# Patient Record
Sex: Female | Born: 1990 | Race: Asian | Hispanic: No | Marital: Married | Smoking: Never smoker
Health system: Southern US, Community
[De-identification: ages and names within clinical notes are randomized; demographics above are authoritative.]

## PROBLEM LIST (undated history)

## (undated) ENCOUNTER — Inpatient Hospital Stay (HOSPITAL_COMMUNITY): Payer: Self-pay

## (undated) DIAGNOSIS — E038 Other specified hypothyroidism: Secondary | ICD-10-CM

## (undated) DIAGNOSIS — D509 Iron deficiency anemia, unspecified: Secondary | ICD-10-CM

## (undated) DIAGNOSIS — Z8679 Personal history of other diseases of the circulatory system: Secondary | ICD-10-CM

## (undated) DIAGNOSIS — Z8742 Personal history of other diseases of the female genital tract: Secondary | ICD-10-CM

## (undated) DIAGNOSIS — Z973 Presence of spectacles and contact lenses: Secondary | ICD-10-CM

## (undated) DIAGNOSIS — L0501 Pilonidal cyst with abscess: Secondary | ICD-10-CM

## (undated) DIAGNOSIS — Z87898 Personal history of other specified conditions: Secondary | ICD-10-CM

## (undated) DIAGNOSIS — Z8639 Personal history of other endocrine, nutritional and metabolic disease: Secondary | ICD-10-CM

---

## 2010-02-24 HISTORY — PX: SHOULDER SURGERY: SHX246

## 2011-12-24 ENCOUNTER — Encounter (HOSPITAL_COMMUNITY): Payer: Self-pay | Admitting: *Deleted

## 2011-12-24 ENCOUNTER — Emergency Department (INDEPENDENT_AMBULATORY_CARE_PROVIDER_SITE_OTHER): Payer: Managed Care, Other (non HMO)

## 2011-12-24 ENCOUNTER — Emergency Department (HOSPITAL_COMMUNITY)
Admission: EM | Admit: 2011-12-24 | Discharge: 2011-12-24 | Disposition: A | Discharge status: Home / Self Care | Payer: Managed Care, Other (non HMO) | Source: Home / Self Care | Attending: Family Medicine | Admitting: Family Medicine

## 2011-12-24 DIAGNOSIS — S63602A Unspecified sprain of left thumb, initial encounter: Secondary | ICD-10-CM

## 2011-12-24 DIAGNOSIS — S6390XA Sprain of unspecified part of unspecified wrist and hand, initial encounter: Secondary | ICD-10-CM

## 2011-12-24 MED ORDER — IBUPROFEN 600 MG PO TABS: 600.0000 mg | ORAL_TABLET | Freq: Three times a day (TID) | ORAL | Status: DC

## 2011-12-24 MED ORDER — TRAMADOL HCL 50 MG PO TABS: 50.0000 mg | ORAL_TABLET | Freq: Four times a day (QID) | ORAL | Status: DC | PRN

## 2011-12-24 NOTE — ED Notes (Signed)
Pt reports  While  Doing  An  Exercise   This  Am   She  Injured  Her  l  Thumb  She  Reports  It  Bent  Back  When    She  Felled  On it     She  Has  Some  Pain and  Slight  Swelling     noted

## 2011-12-24 NOTE — ED Notes (Signed)
Small  l  Thumb  Spica  Applied

## 2011-12-25 NOTE — ED Provider Notes (Signed)
History     CSN: [id number]  Arrival date & time 12/24/11  0865   First MD Initiated Contact with Patient 12/24/11 [phone]      Chief Complaint  Patient presents with  . Hand Injury    (Consider location/radiation/quality/duration/timing/severity/associated sxs/prior treatment) HPI Comments: 21 year old female otherwise previously healthy. Here complaining of left thumb pain. Patient works as a Systems analyst. She describes as she lost her balance while doing exercise this morning and hyper extended her left thumb backwards and also fell on it. If that have been couple of hours ago. She has noted some swelling associated with her pain. Denies dizziness or syncope associated with her fall. Denies injuring her head or any other body areas.   Past Medical History  Diagnosis Date  . Low BP     Past Surgical History  Procedure Date  . Shoulder surgery     No family history on file.  History  Substance Use Topics  . Smoking status: Never Smoker   . Smokeless tobacco: Not on file  . Alcohol Use: No    OB History    Grav Para Term Preterm Abortions TAB SAB Ect Mult Living                  Review of Systems  HENT:       Denies head injury  Musculoskeletal:       As per history of present illness  Neurological: Negative for dizziness, seizures, weakness, numbness and headaches.  All other systems reviewed and are negative.    Allergies  Review of patient's allergies indicates not on file.  Home Medications   Current Outpatient Rx  Name Route Sig Dispense Refill  . IBUPROFEN 600 MG PO TABS Oral Take 1 tablet (600 mg total) by mouth 3 (three) times daily. 21 tablet 0  . TRAMADOL HCL 50 MG PO TABS Oral Take 1 tablet (50 mg total) by mouth every 6 (six) hours as needed for pain. 15 tablet 0    BP 124/77  Pulse 69  Temp 98.2 F (36.8 C) (Oral)  Resp 16  SpO2 100%  LMP 12/14/2011  Physical Exam  Nursing note and vitals reviewed. Constitutional: She appears  well-developed and well-nourished. No distress.  HENT:  Head: Normocephalic and atraumatic.  Eyes: EOM are normal.  Cardiovascular: Normal heart sounds.   Pulmonary/Chest: Breath sounds normal.  Musculoskeletal:       Left hand: Tenderness with palpation and mild swelling without bruising noted at the base of the left thumb more on the palmar side. Patient able to abduct and adduct her left thumb. Normal thumb opposition. Patient able to flex and extend first MP joint as well as DIP joint. There is intact 2 point discrimination sensation over the dorsal and palmar left thumb.    ED Course  Procedures (including critical care time)  Labs Reviewed - No data to display Dg Finger Thumb Left  12/24/2011  *RADIOLOGY REPORT*  Clinical Data: Hand injury  LEFT THUMB 2+V  Comparison: None.  Findings: Three views of the left thumb submitted.  No acute fracture or subluxation.  No radiopaque foreign body.  IMPRESSION: No acute fracture or subluxation.   Original Report Authenticated By: Natasha Mead, M.D.      1. Left thumb sprain       MDM  No bone injuries on x-rays. Impress sprain injury as sore. Range of motion of flexor and extensor tendons on exam. Placed on a thumb spica. Supportive care and  rehabilitation exercises discussed with patient and provided in writing. Asked to followup with a hand specialist if persistent symptoms after 1 week or followup earlier if worsening symptoms despite following treatment.         Sharin Grave, MD 12/26/11 [phone]

## 2012-11-23 ENCOUNTER — Ambulatory Visit: Payer: BC Managed Care – PPO

## 2012-11-23 NOTE — Progress Notes (Unsigned)
  Subjective:    Patient ID: Laura Baxter, female    DOB: 1990/04/19, 22 y.o.   MRN: [id number]  HPI    Review of Systems     Objective:   Physical Exam        Assessment & Plan:

## 2013-03-13 ENCOUNTER — Emergency Department (HOSPITAL_COMMUNITY)
Admission: EM | Admit: 2013-03-13 | Discharge: 2013-03-14 | Disposition: A | Discharge status: Home / Self Care | Payer: BC Managed Care – PPO | Attending: Emergency Medicine | Admitting: Emergency Medicine

## 2013-03-13 ENCOUNTER — Encounter (HOSPITAL_COMMUNITY): Payer: Self-pay | Admitting: Emergency Medicine

## 2013-03-13 ENCOUNTER — Emergency Department (HOSPITAL_COMMUNITY): Payer: BC Managed Care – PPO

## 2013-03-13 DIAGNOSIS — Z3202 Encounter for pregnancy test, result negative: Secondary | ICD-10-CM | POA: Insufficient documentation

## 2013-03-13 DIAGNOSIS — R0602 Shortness of breath: Secondary | ICD-10-CM | POA: Insufficient documentation

## 2013-03-13 DIAGNOSIS — R079 Chest pain, unspecified: Secondary | ICD-10-CM

## 2013-03-13 DIAGNOSIS — M79609 Pain in unspecified limb: Secondary | ICD-10-CM | POA: Insufficient documentation

## 2013-03-13 DIAGNOSIS — R0789 Other chest pain: Secondary | ICD-10-CM | POA: Insufficient documentation

## 2013-03-13 DIAGNOSIS — R209 Unspecified disturbances of skin sensation: Secondary | ICD-10-CM | POA: Insufficient documentation

## 2013-03-13 DIAGNOSIS — R002 Palpitations: Secondary | ICD-10-CM | POA: Insufficient documentation

## 2013-03-13 LAB — CBC
HCT: 39.2 % (ref 36.0–46.0)
Hemoglobin: 13.4 g/dL (ref 12.0–15.0)
MCH: 30.3 pg (ref 26.0–34.0)
MCHC: 34.2 g/dL (ref 30.0–36.0)
MCV: 88.7 fL (ref 78.0–100.0)
Platelets: 258 10*3/uL (ref 150–400)
RBC: 4.42 MIL/uL (ref 3.87–5.11)
RDW: 12.3 % (ref 11.5–15.5)
WBC: 7.2 10*3/uL (ref 4.0–10.5)

## 2013-03-13 LAB — POCT I-STAT TROPONIN I: Troponin i, poc: 0 ng/mL (ref 0.00–0.08)

## 2013-03-13 LAB — BASIC METABOLIC PANEL
BUN: 18 mg/dL (ref 6–23)
CO2: 27 mEq/L (ref 19–32)
Calcium: 9.5 mg/dL (ref 8.4–10.5)
Chloride: 101 mEq/L (ref 96–112)
Creatinine, Ser: 0.74 mg/dL (ref 0.50–1.10)
GFR calc Af Amer: >90 mL/min (ref >90)
GFR calc non Af Amer: >90 mL/min (ref >90)
Glucose, Bld: 94 mg/dL (ref 70–99)
Potassium: 4 mEq/L (ref 3.7–5.3)
Sodium: 140 mEq/L (ref 137–147)

## 2013-03-13 LAB — D-DIMER, QUANTITATIVE: D-Dimer, Quant: <0.27 ug/mL-FEU (ref 0.00–0.48)

## 2013-03-13 LAB — POCT PREGNANCY, URINE: Preg Test, Ur: NEGATIVE

## 2013-03-13 MED ORDER — ONDANSETRON HCL 4 MG/2ML IJ SOLN: 4.0000 mg | Freq: Once | INTRAMUSCULAR | Status: DC

## 2013-03-13 MED ORDER — SODIUM CHLORIDE 0.9 % IV BOLUS (SEPSIS)
1000.0000 mL | Freq: Once | INTRAVENOUS | Status: AC
  Administered 2013-03-13: 1000 mL via INTRAVENOUS

## 2013-03-13 MED ORDER — ASPIRIN 81 MG PO CHEW
324.0000 mg | CHEWABLE_TABLET | Freq: Once | ORAL | Status: AC
  Administered 2013-03-13: 324 mg via ORAL
  Filled 2013-03-13: qty 4

## 2013-03-13 NOTE — ED Provider Notes (Signed)
CSN: [id number]     Arrival date & time 03/13/13  1921 History   First MD Initiated Contact with Patient 03/13/13 2015     Chief Complaint  Patient presents with  . Chest Pain  . Shortness of Breath   (Consider location/radiation/quality/duration/timing/severity/associated sxs/prior Treatment) HPI Comments: 23 year old female presents with intermittent chest pain over the past 2 weeks. She states the pain comes intermittently and there is no obvious eliciting factors. The pain is mostly a tightness in her left upper chest and left lateral chest. There's been no rash. She symptoms he is short of breath with this. It seems always occur when she wakes up but is not waking her up out of sleep. It lasts between minutes to up to an hour. It comes and goes a couple times a day but today it happened more frequently and seemed more intense so she sought evaluation in the ER. She sometimes notices her heart rate is up with his happening and sometimes has palpitations. Occasionally she notices a pain shooting down her arm but not every time. She's also had intermittent left leg tingling and numbness. She's had some left leg pain to the lateral aspect of her knee. No swelling. She was recently on a trip where she drove to [place] and went on a cruise for 5 days a couple weeks ago. The pain is currently mild. 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 When she has had these symptoms she will occasionally check her heart rate and it was in the 110s last time she checked.   Past Medical History  Diagnosis Date  . Low BP   . Allergy    Past Surgical History  Procedure Laterality Date  . Shoulder surgery     Family History  Problem Relation Age of Onset  . Depression Mother   . Anemia Mother   . Cancer Mother   . Diabetes Father   . Heart attack Father   . Breast cancer Paternal Grandmother   . Leukemia Paternal Grandmother   . Leukemia Paternal Grandfather     History  Substance Use Topics  . Smoking status: Never Smoker   . Smokeless tobacco: Not on file  . Alcohol Use: No   OB History   Grav Para Term Preterm Abortions TAB SAB Ect Mult Living                 Review of Systems  Constitutional: Negative for fever and chills.  HENT: Negative for congestion.   Respiratory: Positive for chest tightness and shortness of breath. Negative for cough.   Cardiovascular: Positive for chest pain and palpitations. Negative for leg swelling.  Gastrointestinal: Negative for nausea, vomiting and abdominal pain.  Musculoskeletal: Negative for back pain.  Neurological: Positive for numbness. Negative for weakness and headaches.  All other systems reviewed and are negative.    Allergies  Shellfish allergy  Home Medications  No current outpatient prescriptions on file. BP 124/72  Pulse 70  Temp(Src) 98.2 F (36.8 C) (Oral)  Resp 20  Wt 137 lb (62.143 kg)  SpO2 100%  LMP 03/07/2013 Physical Exam  Nursing note and vitals reviewed. Constitutional: She is oriented to person, place, and time. She appears well-developed and well-nourished.  HENT:  Head: Normocephalic and atraumatic.  Right Ear: External ear normal.  Left Ear: External ear normal.  Nose: Nose normal.  Eyes: Right eye exhibits no discharge. Left eye exhibits no discharge.  Cardiovascular: Normal  rate, regular rhythm and normal heart sounds.   Pulmonary/Chest: Effort normal and breath sounds normal. She exhibits no tenderness.  Abdominal: Soft. She exhibits no distension. There is no tenderness.  Musculoskeletal:       Left upper leg: She exhibits tenderness.       Legs: Neurological: She is alert and oriented to person, place, and time.  Skin: Skin is warm and dry.    ED Course  Procedures (including critical care time) Labs Review Labs Reviewed  CBC  BASIC METABOLIC PANEL  D-DIMER, QUANTITATIVE  POCT PREGNANCY, URINE  POCT I-STAT TROPONIN I   Imaging Review Dg  Chest 2 View  03/13/2013   CLINICAL DATA:  Chest pain for 2 weeks.  EXAM: CHEST  2 VIEW  COMPARISON:  None.  FINDINGS: The lungs are well-aerated and clear. There is no evidence of focal opacification, pleural effusion or pneumothorax.  The heart is normal in size; the mediastinal contour is within normal limits. No acute osseous abnormalities are seen.  IMPRESSION: No acute cardiopulmonary process seen.   Electronically Signed   By: Roanna RaiderJeffery  Chang M.D.   On: 03/13/2013 21:19    EKG Interpretation    Date/Time:  Sunday March 13 2013 19:46:41 EST Ventricular Rate:  67 PR Interval:  124 QRS Duration: 70 QT Interval:  416 QTC Calculation: 439 R Axis:   80 Text Interpretation:  Sinus rhythm Benign early repolarization No old tracing to compare Confirmed by Jabre Heo  MD, Janessa Mickle ([id number]) on 03/13/2013 8:15:43 PM            MDM   1. Chest pain    Patient with atypical symptoms. Given that she has no risk factors and is low risk by HEART score with benign EKG and neg troponin I feel ACS is very unlikely. Low risk for PE, and with neg ddimer this is also low risk. She has two benign EKGs here, and symptoms improved. Her leg pain appears to be more c/w iliotibial band tendinitis given her location of pain, no swelling and frequent exercise and running. No swelling to suggest DVT, and is low risk with neg ddimer. Could be having pain relating to these palpitations, and because of this I recommended getting a PCP, may need further studies such as thyroid (unable to get results back in ED). No significant caffeine use or drug use per patient. Discussed return precautions, and patient and significant other verbalized understandign.    Audree CamelScott T Lemmie Steinhaus, MD 03/13/13 [phone]

## 2013-03-13 NOTE — ED Notes (Signed)
Patient is alert and oriented x3.  She is complaining of chest pain that started 2 weeks ago.  She states that the pain is in  Her upper left chest and she has had radiating pain with numbness in the left arm.  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  She states that she has noticed a jump in her heart rate greater than 20 bpm. She has had lightheaded, dizziness and nausea with the chest pain that happens with exercise or resting.  Currently she rates her  Pain 4 of 10.

## 2013-03-13 NOTE — Discharge Instructions (Signed)
Chest Pain (Nonspecific) °It is often hard to give a specific diagnosis for the cause of chest pain. There is always a chance that your pain could be related to something serious, such as a heart attack or a blood clot in the lungs. You need to follow up with your caregiver for further evaluation. °CAUSES  °· Heartburn. °· Pneumonia or bronchitis. °· Anxiety or stress. °· Inflammation around your heart (pericarditis) or lung (pleuritis or pleurisy). °· A blood clot in the lung. °· A collapsed lung (pneumothorax). It can develop suddenly on its own (spontaneous pneumothorax) or from injury (trauma) to the chest. °· Shingles infection (herpes zoster virus). °The chest wall is composed of bones, muscles, and cartilage. Any of these can be the source of the pain. °· The bones can be bruised by injury. °· The muscles or cartilage can be strained by coughing or overwork. °· The cartilage can be affected by inflammation and become sore (costochondritis). °DIAGNOSIS  °Lab tests or other studies, such as X-rays, electrocardiography, stress testing, or cardiac imaging, may be needed to find the cause of your pain.  °TREATMENT  °· Treatment depends on what may be causing your chest pain. Treatment may include: °· Acid blockers for heartburn. °· Anti-inflammatory medicine. °· Pain medicine for inflammatory conditions. °· Antibiotics if an infection is present. °· You may be advised to change lifestyle habits. This includes stopping smoking and avoiding alcohol, caffeine, and chocolate. °· You may be advised to keep your head raised (elevated) when sleeping. This reduces the chance of acid going backward from your stomach into your esophagus. °· Most of the time, nonspecific chest pain will improve within 2 to 3 days with rest and mild pain medicine. °HOME CARE INSTRUCTIONS  °· If antibiotics were prescribed, take your antibiotics as directed. Finish them even if you start to feel better. °· For the next few days, avoid physical  activities that bring on chest pain. Continue physical activities as directed. °· Do not smoke. °· Avoid drinking alcohol. °· Only take over-the-counter or prescription medicine for pain, discomfort, or fever as directed by your caregiver. °· Follow your caregiver's suggestions for further testing if your chest pain does not go away. °· Keep any follow-up appointments you made. If you do not go to an appointment, you could develop lasting (chronic) problems with pain. If there is any problem keeping an appointment, you must call to reschedule. °SEEK MEDICAL CARE IF:  °· You think you are having problems from the medicine you are taking. Read your medicine instructions carefully. °· Your chest pain does not go away, even after treatment. °· You develop a rash with blisters on your chest. °SEEK IMMEDIATE MEDICAL CARE IF:  °· You have increased chest pain or pain that spreads to your arm, neck, jaw, back, or abdomen. °· You develop shortness of breath, an increasing cough, or you are coughing up blood. °· You have severe back or abdominal pain, feel nauseous, or vomit. °· You develop severe weakness, fainting, or chills. °· You have a fever. °THIS IS AN EMERGENCY. Do not wait to see if the pain will go away. Get medical help at once. Call your local emergency services (911 in [place] Do not drive yourself to the hospital. °MAKE SURE YOU:  °· Understand these instructions. °· Will watch your condition. °· Will get help right away if you are not doing well or get worse. °Document Released: 11/20/2004 Document Revised: 05/05/2011 Document Reviewed: 09/16/2007 °ExitCare® Patient Information ©2014 [employer],   LLC. ° ° ° °Emergency Department Resource Guide °1) Find a Doctor and Pay Out of Pocket °Although you won't have to find out who is covered by your insurance plan, it is a good idea to ask around and get recommendations. You will then need to call the office and see if the doctor you have chosen will accept you as a new  patient and what types of options they offer for patients who are self-pay. Some doctors offer discounts or will set up payment plans for their patients who do not have insurance, but you will need to ask so you aren't surprised when you get to your appointment. ° °2) Contact Your Local Health Department °Not all health departments have doctors that can see patients for sick visits, but many do, so it is worth a call to see if yours does. If you don't know where your local health department is, you can check in your phone book. The CDC also has a tool to help you locate your state's health department, and many state websites also have listings of all of their local health departments. ° °3) Find a Walk-in Clinic °If your illness is not likely to be very severe or complicated, you may want to try a walk in clinic. These are popping up all over the country in pharmacies, drugstores, and shopping centers. They're usually staffed by nurse practitioners or physician assistants that have been trained to treat common illnesses and complaints. They're usually fairly quick and inexpensive. However, if you have serious medical issues or chronic medical problems, these are probably not your best option. ° °No Primary Care Doctor: °- Call [facility] at  832-8000 - they can help you locate a primary care doctor that  accepts your insurance, provides certain services, etc. °- Physician Referral Service- 1-800-533-3463 ° °Chronic Pain Problems: °Organization         Address  Phone   Notes  °[facility]  (336) 297-2271 Patients need to be referred by their primary care doctor.  ° °Medication Assistance: °Organization         Address  Phone   Notes  °[place] [facility] [address] °[place], [place] [address] (336) 641-8030 --Must be a resident of [place] °-- Must have NO insurance coverage whatsoever (no Medicaid/ Medicare, etc.) °-- The pt. MUST have a primary  care doctor that directs their care regularly and follows them in the community °  °[facility]  (866) 331-1348   °[employer]  (888) 892-1162   ° °Agencies that provide inexpensive medical care: °Organization         Address  Phone   Notes  °[facility]  (336) 832-8035   °[facility]    (336) 832-7272   °[facility] [address] °[place], [place] [address] (336) 832-4777   °[facility] [address], °[place] (336) 271-4999   °[facility]    (336) 373-0678   °[facility]    (336) 272-1050   °[facility] ° [address], [place] Phone:  (336) 832-4444, Fax:  (336) 832-4440 Hours of Operation:  9 am - 6 pm, M-F.  Also accepts Medicaid/Medicare and self-pay.  °[facility] ° [address], [place] Phone: (336) 832-3150, Fax: (336) [phone]. Hours of Operation:  8:30 am - 5:30 pm, M-F.  Also accepts Medicaid and self-pay.  °  [facility] [place] [address], [place] Phone: (336) 878-6027   °[facility] [address], [place], [place] (336)723-1848, Ext. 123 Mondays & Thursdays: 7-9 AM.  First 15 patients are seen on a first come, first serve basis. °  ° °Medicaid-accepting [place] Providers: ° °Organization         Address  Phone   Notes  °[facility] [address], [place] (336) 641-2100 Also accepts self-pay patients.  °[facility] [address], [place] ° (336) 856-9996   °[facility] [address], [place] (336) 288-8857   °[facility] [address] [place] [address], [place] (336) 299-7000   °Veita Bland [address], [place]  ° (336) 373-1557 Only accepts [place] Access Medicaid patients after they have their name applied to their card.  ° °Self-Pay (no insurance) in [place]: ° °Organization         Address  Phone   Notes  °Sickle Cell Patients, [facility] [address], [place] (336) 832-1970   °[facility] [address], [place] (336) 832-4400   °[facility] ° [address], [place] (336) 992-4800   °[facility]/Dr. Osei-Bonsu ° [address] [place] [address], [place] or [address], [place] (336) 841-8500 Phone number for both [place] and [place] locations is the same.  °[facility] [address], [place] (336) 299-0000   °[facility] [address] [place] [address], [place] or [address] (336) 852-7530 °(336) 878-2260   °[facility] [address], [place] (336) 350-1642, phone; (336) 294-5005, fax Sees patients 1st and 3rd Saturday of every month.  Must not qualify for public or private insurance (i.e. Medicaid, Medicare, [place] Health Choice, Veterans' Benefits) • Household income should be no more than 200% of the poverty level •The clinic cannot treat you if you are pregnant or think you are pregnant • Sexually transmitted diseases are not treated at the clinic.  ° ° °Dental Care: °Organization         Address  Phone  Notes  °[facility] [facility] [address], [place] (336) 641-6152 Accepts children up to age 21 who are enrolled in Medicaid or [place] Health Choice; pregnant women with a Medicaid card; and children who have applied for Medicaid or [place] Health Choice, but were declined, whose parents can pay a reduced fee at time of service.  °[facility] [place]  [address], [place] (336) 641-7733 Accepts children up to age 21 who are enrolled in Medicaid or [place] Health Choice; pregnant women with a Medicaid card; and children who have applied for Medicaid or [place] Health Choice, but were declined, whose parents can  pay a reduced fee at time of service.  °[facility] ° [address], [place] (336) 641-4533 Patients are seen by appointment only. Walk-ins are not accepted. [facility] will see patients 18 years of age and older. °Monday - Tuesday (8am-5pm) °Most Wednesdays (8:30-5pm) °$30 per visit, cash only  °[facility] ° [address], [place] (336) 641-4533 Patients are seen by appointment only. Walk-ins are not accepted. [facility] will see patients 18 years of   age and older. °One Wednesday Evening (Monthly: Volunteer Based).  $30 per visit, cash only  °[facility]  (919) 537-3737 for adults; Children under age 4, call Graduate Pediatric Dentistry at (919) [phone]. Children aged 4-14, please call (919) [phone] to request a pediatric application. ° Dental services are provided in all areas of dental care including fillings, crowns and bridges, complete and partial dentures, implants, gum treatment, root canals, and extractions. Preventive care is also provided. Treatment is provided to both adults and children. °Patients are selected via a lottery and there is often a waiting list. °  °[facility] [address], °[place] ° (336) 763-8833 [email or web address] °  °[facility] [address], [place], [place] (336)723-1848, Ext. 123 Second and Fourth Thursday of each month, opens at 6:30 AM; Clinic ends at 9 AM.  Patients are seen on a first-come first-served basis, and a limited number are seen during each clinic.  ° °[facility] ° [address], [place], [place] (336) 723-7904   Eligibility Requirements °You must have lived in [place], [place], or [place] counties for at least the last three months. °  You cannot be eligible for state or federal sponsored healthcare insurance, including Veterans Administration, Medicaid, or Medicare. °  You generally cannot be eligible for healthcare  insurance through your employer.  °  How to apply: °Eligibility screenings are held every Tuesday and Wednesday afternoon from 1:00 pm until 4:00 pm. You do not need an appointment for the interview!  °[facility] [address], [place], [place] 336-631-2330   °[facility]  336-342-8273   °[facility]  336-703-3100   °[facility]  336-570-6415   ° °Behavioral Health Resources in the Community: °Intensive Outpatient Programs °Organization         Address  Phone  Notes  °[facility] [address], [place], [place] 336-878-6098   °[facility] [address], [place], [place] 336-832-9800   °[facility] [address], [place], [place] ° 336-882-2125   °[facility] [address],  °[place], [place] 1-800-853-5163 or 336-641-4981   °Substance Abuse Resources °Organization         Address  Phone  Notes  °[facility]  336-882-2125   °[facility]  336-784-9470   °[facility]  336-285-9073   °[facility]  336-845-3988   °Residential & Outpatient Substance Abuse Program  1-800-659-3381   °Psychological Services °Organization         Address  Phone  Notes  °[facility]  336- 832-9600   °[facility]  336- 378-7881   °[facility] [address], [place] 1-800-853-5163 or 336-641-4981   ° °Mobile Crisis Teams °Organization         Address  Phone  Notes  °[facility], Mobile Crisis Care Unit  1-877-626-1772   °Assertive °[facility] ° [address] [place], [place] 336-834-9664   °Sharon DeEsch [address] °[place] 336-554-5454   ° °Self-Help/Support Groups °Organization         Address  Phone             Notes  °[facility] - variety of support groups  336- 373-1402 Call for more information  °Narcotics Anonymous (NA),  [facility] [address], °[place]   2   meetings at this location  ° °Residential Treatment Programs °Organization         Address  Phone  Notes  °[facility] [address],    °[place]  1-866-801-8205   °[facility] ° [address], [place], [place] 704-293-8524   °[facility] [address], [place] 336-845-3988 Admissions: 8am-3pm M-F  °[facility] [address]    °[place], [place] 336-841-1104   °[facility] [address], [place], [place] 336-379-7146   °[facility] [address]  °[place], [place] 336-285-9073   °[facility] - Intensive Outpatient [address], [place], [place] 336-852-3033   °[facility] [address]  °[place], [place] 1-877-615-2722 or 336-784-9470   °[facility] [address] [place], [place] 336-227-7417 Accepts Medicaid  °[facility] [address]  °[place] 1-800-659-3381 Substance Abuse/Addiction Treatment  ° °[place] Behavioral Health Resources °Organization         Address  Phone  Notes  °[facility]  (888) 581-9988   °Julie Brannon, PhD [address] [place], [place]   (336) 349-5553 or (336) 951-0000   °[facility]   [address] °[place], [place] (336) 349-4454   °[facility] [address], [place], [place] (336) 342-8316 Insurance/Medicaid/sponsorship through [facility]  °[facility] [address]                                    [place], [place] (336) 342-8316 Therapy/tele-psych/case  °[facility] [address]  ° [place], [place] (336) 349-2233    °Dr. Arfeen  (336) 349-4544   °[facility] [place]  [employer] [place] [facility] 1) [address],  °2) [address], [place] °3)  [address], [place] (336) 349-3220 °(336) 342-7768 ° °(336) 342-8140    °[place] [facility] (336) 342-1394 or (336) 342-3537 (After Hours)    ° ° ° °

## 2013-03-14 NOTE — ED Notes (Signed)
Pt ambulatory with family to check out window with family. NAD.

## 2016-02-25 NOTE — L&D Delivery Note (Signed)
Operative Delivery Note At 2:16 AM, on August 22, 2016, a viable female "Laura Baxter" was delivered via Vaginal, Spontaneous Delivery.  Presentation: vertex; Position: Left,, Occiput,, Anterior with manual restitution to LOT. After delivery of head, shoulders dystocia noted and maneuvers implemented as below resulting in release of anterior shoulder and delivery of infant.  Time from delivery of head to delivery of body less than 40 seconds.  Infant with good tone and tactile stimulation and bulb suction given by provider to stimulate grimace. Infant placed on mother's abdomen where nurse's continued tactile stimulation.  Infant APGAR: 8, 9.  Cord clamped, cut, and blood collected. Placenta delivered spontaneously and noted to be intact with 3VC upon inspection.  Vaginal inspection revealed a 1st degree vaginal laceration and bilateral labial lacerations that was repaired with 3-0 vicryl on CT-1 and SH, respectively.  Patient also with hemostatic periurethral laceration that was not repaired.  Anesthetic necessary and patient given ~1515mL of lidocaine.  Patient tolerated the procedure well. Fundus firm, at the umbilicus, and bleeding small.  Mother hemodynamically stable and infant skin to skin, with father, prior to provider exit.  Mother desires IUD for birth control and opts to breast and bottle feed.  Infant weight at one hour of life: 7lbs 8oz, 19.75 in  Delivery of the head: 08/22/2016  2:16 AM First maneuver: 08/22/2016  2:16 AM, [place] Second maneuver: 08/22/2016  2:16 AM, Suprapubic Pressure Third maneuver: ,   Fourth maneuver: ,   Fifth maneuver: ,   Sixth maneuver: ,    Anesthesia:  Local for Repair Episiotomy: None Lacerations: Bilateral Labial, 1st Degree Vaginal, and Hemostatic Periuretheral Suture Repair: 3.0 vicryl Est. Blood Loss (mL):  300  Mom to postpartum.  Baby to Couplet care / Skin to Skin.  Cherre RobinsJessica L Deeric Cruise MSN, CNM 08/22/2016, 3:09 AM

## 2016-07-10 ENCOUNTER — Encounter: Payer: Managed Care, Other (non HMO) | Admitting: Obstetrics and Gynecology

## 2016-07-17 LAB — OB RESULTS CONSOLE RUBELLA ANTIBODY, IGM: Rubella: IMMUNE

## 2016-07-17 LAB — OB RESULTS CONSOLE RPR: RPR: NONREACTIVE

## 2016-07-17 LAB — OB RESULTS CONSOLE GBS: GBS: NEGATIVE

## 2016-07-17 LAB — OB RESULTS CONSOLE HEPATITIS B SURFACE ANTIGEN: Hepatitis B Surface Ag: NEGATIVE

## 2016-07-17 LAB — OB RESULTS CONSOLE HIV ANTIBODY (ROUTINE TESTING): HIV: NONREACTIVE

## 2016-07-30 ENCOUNTER — Inpatient Hospital Stay (HOSPITAL_COMMUNITY)
Admission: AD | Admit: 2016-07-30 | Discharge: 2016-07-30 | Disposition: A | Discharge status: Home / Self Care | Payer: No Typology Code available for payment source | Source: Ambulatory Visit | Attending: Obstetrics & Gynecology | Admitting: Obstetrics & Gynecology

## 2016-07-30 ENCOUNTER — Encounter (HOSPITAL_COMMUNITY): Payer: Self-pay | Admitting: *Deleted

## 2016-07-30 DIAGNOSIS — O9989 Other specified diseases and conditions complicating pregnancy, childbirth and the puerperium: Secondary | ICD-10-CM

## 2016-07-30 DIAGNOSIS — Z3A38 38 weeks gestation of pregnancy: Secondary | ICD-10-CM | POA: Diagnosis not present

## 2016-07-30 DIAGNOSIS — O99891 Other specified diseases and conditions complicating pregnancy: Secondary | ICD-10-CM

## 2016-07-30 DIAGNOSIS — M549 Dorsalgia, unspecified: Secondary | ICD-10-CM

## 2016-07-30 DIAGNOSIS — Z91013 Allergy to seafood: Secondary | ICD-10-CM | POA: Diagnosis not present

## 2016-07-30 DIAGNOSIS — Z79899 Other long term (current) drug therapy: Secondary | ICD-10-CM | POA: Diagnosis not present

## 2016-07-30 DIAGNOSIS — O471 False labor at or after 37 completed weeks of gestation: Secondary | ICD-10-CM | POA: Diagnosis not present

## 2016-07-30 DIAGNOSIS — Z3689 Encounter for other specified antenatal screening: Secondary | ICD-10-CM

## 2016-07-30 DIAGNOSIS — O479 False labor, unspecified: Secondary | ICD-10-CM

## 2016-07-30 NOTE — MAU Provider Note (Signed)
History     CSN: [id number]  Arrival date and time: 07/30/16 1513   None     Chief Complaint  Patient presents with  . Labor Eval   HPI   [name] is a 26 y.o. female G1P0 @ [redacted]w[redacted]d here in MAU from the office for an NST. She was seen today in the office and their were ? Variables seen on the monitor. She was sent here by Dr. Sallye Ober. She has had some back pain and occasional contraction pain. No vaginal bleeding or leaking of water.   OB History    Gravida Para Term Preterm AB Living   1             SAB TAB Ectopic Multiple Live Births                  Past Medical History:  Diagnosis Date  . Allergy   . Low BP   . Medical history non-contributory     Past Surgical History:  Procedure Laterality Date  . SHOULDER SURGERY      Family History  Problem Relation Age of Onset  . Depression Mother   . Anemia Mother   . Cancer Mother   . Diabetes Father   . Heart attack Father   . Breast cancer Paternal Grandmother   . Leukemia Paternal Grandmother   . Leukemia Paternal Grandfather     Social History  Substance Use Topics  . Smoking status: Never Smoker  . Smokeless tobacco: Never Used  . Alcohol use No    Allergies:  Allergies  Allergen Reactions  . Iodine Anaphylaxis  . Shellfish Allergy Anaphylaxis    Prescriptions Prior to Admission  Medication Sig Dispense Refill Last Dose  . IRON PO Take 1 tablet by mouth every morning.   07/30/2016 at Unknown time  . Prenatal Vit-Fe Fumarate-FA (PRENATAL MULTIVITAMIN) TABS tablet Take 1 tablet by mouth daily at 12 noon.   07/30/2016 at Unknown time  . promethazine (PHENERGAN) 12.5 MG tablet Take 12.5 mg by mouth every 6 (six) hours as needed for nausea or vomiting.   Past Week at Unknown time   No results found for this or any previous visit (from the past 48 hour(s)).  Review of Systems  Gastrointestinal: Negative for abdominal pain.  Genitourinary: Negative for vaginal bleeding.   Physical Exam   Blood  pressure 116/69, pulse 98, temperature 98.4 F (36.9 C), temperature source Oral, resp. rate 16, height 5\' 4"  (1.626 m), weight 166 lb (75.3 kg), SpO2 98 %.  Physical Exam  Constitutional: She appears well-developed and well-nourished.  HENT:  Head: Normocephalic.  Respiratory: Effort normal.  GI: Soft. She exhibits no distension. There is no tenderness. There is no rebound and no guarding.  Genitourinary:  Genitourinary Comments: Dilation: 1 Effacement (%): Thick Cervical Position: Posterior Station: -2 Presentation: Vertex Exam by:: [place]on,RN  Musculoskeletal: Normal range of motion.  Skin: Skin is warm. She is not diaphoretic.  Psychiatric: Her behavior is normal.   Fetal Tracing: Baseline: 125 bpm Variability: Moderate  Accelerations: 15x15 Decelerations: None Toco: Occasional UI  MAU Course  Procedures  None  MDM  Reactive NST Cervix unchanged from prior exam.  Discussed patient with Dr. Sallye Ober  Assessment and Plan   A:  1. NST (non-stress test) reactive   2. Back pain in pregnancy   3. False labor     P:  Discharge home in stable condition Return to MAU if symptoms worsen Ok to use tylenol as  needed, as directed on the bottle  Follow up with [facility]  Keaundra Stehle I, NP 07/30/2016 5:49 PM

## 2016-07-30 NOTE — MAU Note (Signed)
Pt reports she "had a really tight belly for 24 hours" and went to the doctor's office today and they put her on the monitor and the baby's heartrate was high and she was having contractions.

## 2016-07-30 NOTE — Discharge Instructions (Signed)
Third Trimester of Pregnancy The third trimester is from week 28 through week 40 (months 7 through 9). The third trimester is a time when the unborn baby (fetus) is growing rapidly. At the end of the ninth month, the fetus is about 20 inches in length and weighs 6-10 pounds. Body changes during your third trimester Your body will continue to go through many changes during pregnancy. The changes vary from woman to woman. During the third trimester:  Your weight will continue to increase. You can expect to gain 25-35 pounds (11-16 kg) by the end of the pregnancy.  You may begin to get stretch marks on your hips, abdomen, and breasts.  You may urinate more often because the fetus is moving lower into your pelvis and pressing on your bladder.  You may develop or continue to have heartburn. This is caused by increased hormones that slow down muscles in the digestive tract.  You may develop or continue to have constipation because increased hormones slow digestion and cause the muscles that push waste through your intestines to relax.  You may develop hemorrhoids. These are swollen veins (varicose veins) in the rectum that can itch or be painful.  You may develop swollen, bulging veins (varicose veins) in your legs.  You may have increased body aches in the pelvis, back, or thighs. This is due to weight gain and increased hormones that are relaxing your joints.  You may have changes in your hair. These can include thickening of your hair, rapid growth, and changes in texture. Some women also have hair loss during or after pregnancy, or hair that feels dry or thin. Your hair will most likely return to normal after your baby is born.  Your breasts will continue to grow and they will continue to become tender. A yellow fluid (colostrum) may leak from your breasts. This is the first milk you are producing for your baby.  Your belly button may stick out.  You may notice more swelling in your hands,  face, or ankles.  You may have increased tingling or numbness in your hands, arms, and legs. The skin on your belly may also feel numb.  You may feel short of breath because of your expanding uterus.  You may have more problems sleeping. This can be caused by the size of your belly, increased need to urinate, and an increase in your body's metabolism.  You may notice the fetus "dropping," or moving lower in your abdomen (lightening).  You may have increased vaginal discharge.  You may notice your joints feel loose and you may have pain around your pelvic bone.  What to expect at prenatal visits You will have prenatal exams every 2 weeks until week 36. Then you will have weekly prenatal exams. During a routine prenatal visit:  You will be weighed to make sure you and the baby are growing normally.  Your blood pressure will be taken.  Your abdomen will be measured to track your baby's growth.  The fetal heartbeat will be listened to.  Any test results from the previous visit will be discussed.  You may have a cervical check near your due date to see if your cervix has softened or thinned (effaced).  You will be tested for Group B streptococcus. This happens between 35 and 37 weeks.  Your health care provider may ask you:  What your birth plan is.  How you are feeling.  If you are feeling the baby move.  If you have had   any abnormal symptoms, such as leaking fluid, bleeding, severe headaches, or abdominal cramping.  If you are using any tobacco products, including cigarettes, chewing tobacco, and electronic cigarettes.  If you have any questions.  Other tests or screenings that may be performed during your third trimester include:  Blood tests that check for low iron levels (anemia).  Fetal testing to check the health, activity level, and growth of the fetus. Testing is done if you have certain medical conditions or if there are problems during the  pregnancy.  Nonstress test (NST). This test checks the health of your baby to make sure there are no signs of problems, such as the baby not getting enough oxygen. During this test, a belt is placed around your belly. The baby is made to move, and its heart rate is monitored during movement.  What is false labor? False labor is a condition in which you feel small, irregular tightenings of the muscles in the womb (contractions) that usually go away with rest, changing position, or drinking water. These are called Braxton Hicks contractions. Contractions may last for hours, days, or even weeks before true labor sets in. If contractions come at regular intervals, become more frequent, increase in intensity, or become painful, you should see your health care provider. What are the signs of labor?  Abdominal cramps.  Regular contractions that start at 10 minutes apart and become stronger and more frequent with time.  Contractions that start on the top of the uterus and spread down to the lower abdomen and back.  Increased pelvic pressure and dull back pain.  A watery or bloody mucus discharge that comes from the vagina.  Leaking of amniotic fluid. This is also known as your "water breaking." It could be a slow trickle or a gush. Let your health care provider know if it has a color or strange odor. If you have any of these signs, call your health care provider right away, even if it is before your due date. Follow these instructions at home: Medicines  Follow your health care provider's instructions regarding medicine use. Specific medicines may be either safe or unsafe to take during pregnancy.  Take a prenatal vitamin that contains at least 600 micrograms (mcg) of folic acid.  If you develop constipation, try taking a stool softener if your health care provider approves. Eating and drinking  Eat a balanced diet that includes fresh fruits and vegetables, whole grains, good sources of protein  such as meat, eggs, or tofu, and low-fat dairy. Your health care provider will help you determine the amount of weight gain that is right for you.  Avoid raw meat and uncooked cheese. These carry germs that can cause birth defects in the baby.  If you have low calcium intake from food, talk to your health care provider about whether you should take a daily calcium supplement.  Eat four or five small meals rather than three large meals a day.  Limit foods that are high in fat and processed sugars, such as fried and sweet foods.  To prevent constipation: ? Drink enough fluid to keep your urine clear or pale yellow. ? Eat foods that are high in fiber, such as fresh fruits and vegetables, whole grains, and beans. Activity  Exercise only as directed by your health care provider. Most women can continue their usual exercise routine during pregnancy. Try to exercise for 30 minutes at least 5 days a week. Stop exercising if you experience uterine contractions.  Avoid heavy   lifting.  Do not exercise in extreme heat or humidity, or at high altitudes.  Wear low-heel, comfortable shoes.  Practice good posture.  You may continue to have sex unless your health care provider tells you otherwise. Relieving pain and discomfort  Take frequent breaks and rest with your legs elevated if you have leg cramps or low back pain.  Take warm sitz baths to soothe any pain or discomfort caused by hemorrhoids. Use hemorrhoid cream if your health care provider approves.  Wear a good support bra to prevent discomfort from breast tenderness.  If you develop varicose veins: ? Wear support pantyhose or compression stockings as told by your healthcare provider. ? Elevate your feet for 15 minutes, 3-4 times a day. Prenatal care  Write down your questions. Take them to your prenatal visits.  Keep all your prenatal visits as told by your health care provider. This is important. Safety  Wear your seat belt at  all times when driving.  Make a list of emergency phone numbers, including numbers for family, friends, the hospital, and police and fire departments. General instructions  Avoid cat litter boxes and soil used by cats. These carry germs that can cause birth defects in the baby. If you have a cat, ask someone to clean the litter box for you.  Do not travel far distances unless it is absolutely necessary and only with the approval of your health care provider.  Do not use hot tubs, steam rooms, or saunas.  Do not drink alcohol.  Do not use any products that contain nicotine or tobacco, such as cigarettes and e-cigarettes. If you need help quitting, ask your health care provider.  Do not use any medicinal herbs or unprescribed drugs. These chemicals affect the formation and growth of the baby.  Do not douche or use tampons or scented sanitary pads.  Do not cross your legs for long periods of time.  To prepare for the arrival of your baby: ? Take prenatal classes to understand, practice, and ask questions about labor and delivery. ? Make a trial run to the hospital. ? Visit the hospital and tour the maternity area. ? Arrange for maternity or paternity leave through employers. ? Arrange for family and friends to take care of pets while you are in the hospital. ? Purchase a rear-facing car seat and make sure you know how to install it in your car. ? Pack your hospital bag. ? Prepare the baby's nursery. Make sure to remove all pillows and stuffed animals from the baby's crib to prevent suffocation.  Visit your dentist if you have not gone during your pregnancy. Use a soft toothbrush to brush your teeth and be gentle when you floss. Contact a health care provider if:  You are unsure if you are in labor or if your water has broken.  You become dizzy.  You have mild pelvic cramps, pelvic pressure, or nagging pain in your abdominal area.  You have lower back pain.  You have persistent  nausea, vomiting, or diarrhea.  You have an unusual or bad smelling vaginal discharge.  You have pain when you urinate. Get help right away if:  Your water breaks before 37 weeks.  You have regular contractions less than 5 minutes apart before 37 weeks.  You have a fever.  You are leaking fluid from your vagina.  You have spotting or bleeding from your vagina.  You have severe abdominal pain or cramping.  You have rapid weight loss or weight gain.    You have shortness of breath with chest pain.  You notice sudden or extreme swelling of your face, hands, ankles, feet, or legs.  Your baby makes fewer than 10 movements in 2 hours.  You have severe headaches that do not go away when you take medicine.  You have vision changes. Summary  The third trimester is from week 28 through week 40, months 7 through 9. The third trimester is a time when the unborn baby (fetus) is growing rapidly.  During the third trimester, your discomfort may increase as you and your baby continue to gain weight. You may have abdominal, leg, and back pain, sleeping problems, and an increased need to urinate.  During the third trimester your breasts will keep growing and they will continue to become tender. A yellow fluid (colostrum) may leak from your breasts. This is the first milk you are producing for your baby.  False labor is a condition in which you feel small, irregular tightenings of the muscles in the womb (contractions) that eventually go away. These are called Braxton Hicks contractions. Contractions may last for hours, days, or even weeks before true labor sets in.  Signs of labor can include: abdominal cramps; regular contractions that start at 10 minutes apart and become stronger and more frequent with time; watery or bloody mucus discharge that comes from the vagina; increased pelvic pressure and dull back pain; and leaking of amniotic fluid. This information is not intended to replace advice  given to you by your health care provider. Make sure you discuss any questions you have with your health care provider. Document Released: 02/04/2001 Document Revised: 07/19/2015 Document Reviewed: 04/13/2012 Elsevier Interactive Patient Education  2017 [employer] Inc[employer]  

## 2016-08-15 ENCOUNTER — Inpatient Hospital Stay (HOSPITAL_COMMUNITY)
Admission: AD | Admit: 2016-08-15 | Discharge: 2016-08-16 | Disposition: A | Discharge status: Home / Self Care | Payer: No Typology Code available for payment source | Source: Ambulatory Visit | Attending: Obstetrics and Gynecology | Admitting: Obstetrics and Gynecology

## 2016-08-15 DIAGNOSIS — Z3A40 40 weeks gestation of pregnancy: Secondary | ICD-10-CM | POA: Insufficient documentation

## 2016-08-15 NOTE — MAU Note (Signed)
Ctxs since 1900. Some bloody show. 3cm in office this am

## 2016-08-16 ENCOUNTER — Encounter (HOSPITAL_COMMUNITY): Payer: Self-pay | Admitting: *Deleted

## 2016-08-16 DIAGNOSIS — Z3A40 40 weeks gestation of pregnancy: Secondary | ICD-10-CM | POA: Diagnosis not present

## 2016-08-16 NOTE — MAU Provider Note (Signed)
History    Laura Baxter is a 26y.o. G1P0 at 40.3wks who presents, after phone call, for contractions.  Patient states contractions started at 1900 and became regular around 2130.  Patient denies LOF and VB while endorsing fetal movement.  Patient desires minimal interventions and is open to pain management, but declines at current.   There are no active problems to display for this patient.   Chief Complaint  Patient presents with  . Contractions   HPI  OB History    Gravida Para Term Preterm AB Living   1             SAB TAB Ectopic Multiple Live Births                  Past Medical History:  Diagnosis Date  . Allergy   . Low BP   . Medical history non-contributory     Past Surgical History:  Procedure Laterality Date  . SHOULDER SURGERY      Family History  Problem Relation Age of Onset  . Depression Mother   . Anemia Mother   . Cancer Mother   . Diabetes Father   . Heart attack Father   . Breast cancer Paternal Grandmother   . Leukemia Paternal Grandmother   . Leukemia Paternal Grandfather     Social History  Substance Use Topics  . Smoking status: Never Smoker  . Smokeless tobacco: Never Used  . Alcohol use No    Allergies:  Allergies  Allergen Reactions  . Iodine Anaphylaxis  . Shellfish Allergy Anaphylaxis    Prescriptions Prior to Admission  Medication Sig Dispense Refill Last Dose  . IRON PO Take 1 tablet by mouth every morning.   07/30/2016 at Unknown time  . Prenatal Vit-Fe Fumarate-FA (PRENATAL MULTIVITAMIN) TABS tablet Take 1 tablet by mouth daily at 12 noon.   07/30/2016 at Unknown time  . promethazine (PHENERGAN) 12.5 MG tablet Take 12.5 mg by mouth every 6 (six) hours as needed for nausea or vomiting.   Past Week at Unknown time    ROS  See HPI Above Physical Exam   Blood pressure (!) 106/56, pulse 71, temperature 97.4 F (36.3 C), resp. rate 18, height 5\' 4"  (1.626 m), weight 76.7 kg (169 lb).  No results found for this or any  previous visit (from the past 24 hour(s)).  Physical Exam  Constitutional: She is oriented to person, place, and time. She appears well-developed and well-nourished. No distress.  HENT:  Head: Normocephalic and atraumatic.  Eyes: Conjunctivae are normal.  Neck: Normal range of motion.  Cardiovascular: Normal rate.   Respiratory: Effort normal.  GI: Soft.  Musculoskeletal: Normal range of motion. She exhibits no edema.  Neurological: She is alert and oriented to person, place, and time.  Skin: Skin is warm and dry.   SVE: 4/70/-3  FHR: 125 bpm, Mod Var, -Decels, +Accels UC: Q118min, graphs mild ED Course  Assessment: IUP at 40.3wks Cat I FT Early Labor Desires Minimal Intervention  Plan: -Given Early Labor Options:  A: Home with or w/o therapeutic rest B: Ambulate and reassess C: Pain medication and reassess  -Patient opts for B -Can ambulate after reactive NST -Will reassess in 2 hours  Follow Up (0205) -SVE remains the same -Discharge to home Encouraged to call if any questions or concerns arise prior to next scheduled office visit.    Cherre RobinsJessica L Phelan Schadt CNM, MSN 08/16/2016 12:45 AM

## 2016-08-16 NOTE — Discharge Instructions (Signed)

## 2016-08-17 ENCOUNTER — Inpatient Hospital Stay (HOSPITAL_COMMUNITY)
Admission: AD | Admit: 2016-08-17 | Discharge: 2016-08-17 | Disposition: A | Discharge status: Home / Self Care | Payer: No Typology Code available for payment source | Source: Ambulatory Visit | Attending: Obstetrics & Gynecology | Admitting: Obstetrics & Gynecology

## 2016-08-17 ENCOUNTER — Encounter (HOSPITAL_COMMUNITY): Payer: Self-pay | Admitting: *Deleted

## 2016-08-17 ENCOUNTER — Inpatient Hospital Stay (HOSPITAL_COMMUNITY): Payer: No Typology Code available for payment source

## 2016-08-17 DIAGNOSIS — Q6689 Other  specified congenital deformities of feet: Secondary | ICD-10-CM

## 2016-08-17 DIAGNOSIS — Z8279 Family history of other congenital malformations, deformations and chromosomal abnormalities: Secondary | ICD-10-CM

## 2016-08-17 DIAGNOSIS — Z91013 Allergy to seafood: Secondary | ICD-10-CM

## 2016-08-17 DIAGNOSIS — O480 Post-term pregnancy: Secondary | ICD-10-CM

## 2016-08-17 DIAGNOSIS — Z888 Allergy status to other drugs, medicaments and biological substances status: Secondary | ICD-10-CM

## 2016-08-17 NOTE — Discharge Instructions (Signed)

## 2016-08-17 NOTE — MAU Provider Note (Signed)
History   26 yo G1P0 at 70 4/7 weeks presented after calling with "something hanging out of vagina"--denied leaking/bleeding, reported +FM.  Seen 08/16/16 in MAU by Gerrit Heck, CNM, with UCs--cervix remained unchanged at 4 cm, with UCs q 8 min, mild.  Patient waiting to hear about induction to be scheduled at 41 weeks, agreeable with that plan.  No office visits scheduled this upcoming week.  Patient is transfer from [place] at 36 weeks--GBS negative, [place] for EFW at 36 weeks at 36%ile, AFI 70%ile.  Patient Active Problem List   Diagnosis Date Noted  . 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 Family history of cleft lip and palate 08/17/2016  . Post-dates pregnancy 08/17/2016    Chief Complaint  Patient presents with  . ?something coming out of vagina   HPI:  As above  OB History    Gravida Para Term Preterm AB Living   1             SAB TAB Ectopic Multiple Live Births                  Past Medical History:  Diagnosis Date  . Allergy   . Anemia   . Infection    UTI  . Leg fracture    shin, ? left  . Low BP   . Ovarian cyst   . Seizures (HCC)    febrile as infant  . Wrist fracture    right    Past Surgical History:  Procedure Laterality Date  . SHOULDER SURGERY Left     Family History  Problem Relation Age of Onset  . Depression Mother   . Anemia Mother   . Heart attack Father   . Hypertension Father   . Hyperlipidemia Father   . Heart disease Father   . Depression Father   . Glaucoma Father   . Cancer Maternal Grandmother        pancreatic  . Breast cancer Paternal Grandmother   . Leukemia Paternal Grandmother   . Leukemia Paternal Grandfather     Social History  Substance Use Topics  . Smoking status: Never Smoker  . Smokeless tobacco: Never Used  . Alcohol use No    Allergies:  Allergies  Allergen Reactions  . Iodine Anaphylaxis  . Shellfish Allergy Anaphylaxis    Prescriptions Prior to Admission  Medication Sig Dispense Refill  Last Dose  . IRON PO Take 1 tablet by mouth every morning.   07/30/2016 at Unknown time  . Prenatal Vit-Fe Fumarate-FA (PRENATAL MULTIVITAMIN) TABS tablet Take 1 tablet by mouth daily at 12 noon.   07/30/2016 at Unknown time  . promethazine (PHENERGAN) 12.5 MG tablet Take 12.5 mg by mouth every 6 (six) hours as needed for nausea or vomiting.   Past Week at Unknown time    ROS:  Occasional UCs, +FM. Physical Exam   Blood pressure 112/73, pulse 91, temperature 98.1 F (36.7 C), temperature source Oral, resp. rate 16.    Physical Exam  In NAD Chest clear Heart RRR without murmur Abd gravid, NT Pelvic--slightly engorged vaginal tissue at introitus, no prolapsed cord, cervix  3 cm, 70%, vtx, -1, mucusy d/c noted. Ext WNL  FHR Category 1--baseline 160-170, with accels to 180 with frequent fetal movement, no audible decels. UCs none  ED Course  Assessment: IUP at 40 4/7 weeks No evidence prolapsed cord No evidence labor Post-dates  Plan: Continue to monitor FHR at present. If baseline resolves to clearly WNL, will d/c home.  Nigel BridgemanLATHAM, Thadius Smisek CNM, MSN 08/17/2016 5:40 PM   Addendum: FHR Category 1, with FHR baseline segment 145-155, accels noted, frequent FM, but other segments have baseline 160-170, with accels. Baseline on NST 6/23 was 120-130. No UCs.  Will get BPP and AFI.  If WNL, anticipate d/c patient home. Dr. Sallye OberKulwa updated on patient status.  Nigel BridgemanVicki Loanne Emery, CNM 08/17/16   Addendum: Returned from US--BPP 8/8, fetus very active, AFI WNL. D/C'd home with labor precautions, FKCs. Office will contact her tomorrow to schedule ROB visit and NST for 6/27. Scheduled for induction 08/21/16 at 7am. Call prn with questions or concerns.  Nigel BridgemanVicki Alford Gamero, CNM 08/17/16 7:07p

## 2016-08-18 ENCOUNTER — Telehealth (HOSPITAL_COMMUNITY): Payer: Self-pay | Admitting: *Deleted

## 2016-08-18 ENCOUNTER — Encounter (HOSPITAL_COMMUNITY): Payer: Self-pay | Admitting: *Deleted

## 2016-08-18 NOTE — Telephone Encounter (Signed)
Preadmission screen  

## 2016-08-20 NOTE — H&P (Signed)
Laura Baxter is a 26 y.o. female, G1P0 at 1741 1/7 weeks, presenting for induction due to postdates.    Patient Active Problem List   Diagnosis Date Noted  . 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 Family history of cleft lip and palate 08/17/2016  . Post-dates pregnancy 08/17/2016  . Shellfish allergy 08/17/2016  . 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   Anatomy scan:  Approx 20 weeks, with normal findings and an anterior placenta.   Additional US evaluations:   36 1/7 weeks:  EFW 37%ile, vtx, AFI 18.8 Significant prenatal events:  Transfer in at 36 weeks with prior uncomplicated pregnancy.  Hx of normal Panorama, with female gender, no AFP.  Sent to MAU for evaluation on 07/30/16 due to NST with ? Baseline--reactive NST there, no issues.  Seen in MAU 6/24 for ? Labor, cervix 4, 70%, vtx, -3, irregular contractions, Cat 1 FHR.  Seen 08/17/16 in MAU, cervix 3-4, occasional UCs, FHR baseline at times 160-170 with accels, BPP 8/8, AFI 20, then baseline decreased to 150s with clear reactivity.  D/c'd home, with plan for induction on 6/28 (6/27 is 41 weeks, but patient requested 6/28 due to husband's schedule.   Last evaluation:  08/20/16--cervix 3-4 cm, 70%, vtx, -2, BP 120/70, weight 168, reactive NST.  OB History    Gravida Para Term Preterm AB Living   2       1     SAB TAB Ectopic Multiple Live Births   1            2011--SAB   Past Medical History:  Diagnosis Date  . Allergy   . Anemia   . Infection    UTI  . Leg fracture    shin, ? left  . Low BP   . Ovarian cyst   . Seizures (HCC)    febrile as infant  . Wrist fracture    right   Past Surgical History:  Procedure Laterality Date  . SHOULDER SURGERY Left    Family History: family history includes Anemia in  her mother; Breast cancer in her paternal grandmother; Cancer in her maternal grandmother; Depression in her father and mother; Glaucoma in her father; Luiz BlareGraves' disease in her father; Heart attack in her father; Heart disease in her father; Hyperlipidemia in her father; Hypertension in her father; Leukemia in her paternal grandfather and paternal grandmother.   Social History:  reports that she has never smoked. She has never used smokeless tobacco. She reports that she does not drink alcohol or use drugs. Patient is Caucasian, a Careers information officerlaw student, married to [place] Cherry, who is involved and supportive.   Prenatal Transfer Tool  Maternal Diabetes: No Genetic Screening: Normal Panorama and CF screening Maternal Ultrasounds/Referrals: Normal Fetal Ultrasounds or other Referrals:  None Maternal Substance Abuse:  No Significant Maternal Medications:  None Significant Maternal Lab Results: Lab values include: Group B Strep negative  TDAP 06/24/16 Flu 04/24/16  ROS:  +FM, occasional UCs.  Allergies  Allergen Reactions  . Iodine Anaphylaxis  . Shellfish Allergy Anaphylaxis       There were no vitals taken for this visit.  Chest clear Heart RRR without murmur Abd gravid, NT, FH 39 cm Pelvic:Cervix loose 3 cm, 75%, vtx, -2. Ext: WNL  FHR:  Category 1 UCs:  Moderate irritability, sporadic UCs, patient unaware of most.  Prenatal labs: ABO, Rh:  B+ Antibody:  Neg Rubella:  Immune RPR:   NR HBsAg:   Neg HIV:   NR GBS: Negative (05/24 0000) Sickle cell/Hgb electrophoresis: NA Pap:  10/2015, WNL GC:  Neg 01/07/16 Chlamydia:  Neg  01/07/16 Genetic screenings:  Normal Panorama, CF testing  Glucola:  WNL Other:   Hgb 12.9 at NOB, 11.3 at 28 weeks Panorama WNL CF screening negative       Assessment/Plan: IUP at 41 1/7 weeks--admitted for induction due to post-dates Family hx club foot and cleft palate/lip GBS negative  Plan: Admit to Birthing Suite per consult with Dr.  Su Hilt Routine [facility] admission orders Pain med/epidural prn Risks and benefits of induction were reviewed, including failure of method, prolonged labor, need for further intervention, risk of cesarean.  Patient and family seem to understand these risks and wish to proceed. Options of cytotech, foley bulb, AROM, and pitocin reviewed, with use of each discussed. Will plan use of pitocin due to favorable cervix.  AROM as labor progresses.  Nigel Bridgeman CNM, [place] 08/21/2016, 7:41 AM

## 2016-08-21 ENCOUNTER — Encounter (HOSPITAL_COMMUNITY): Payer: Self-pay

## 2016-08-21 ENCOUNTER — Inpatient Hospital Stay (HOSPITAL_COMMUNITY)
Admission: AD | Admit: 2016-08-21 | Discharge: 2016-08-24 | DRG: 775 | Disposition: A | Discharge status: Home / Self Care | Payer: No Typology Code available for payment source | Source: Ambulatory Visit | Attending: Obstetrics and Gynecology | Admitting: Obstetrics and Gynecology

## 2016-08-21 VITALS — BP 104/60 | HR 67 | Temp 98.0°F | Resp 12 | Ht 64.0 in | Wt 169.0 lb

## 2016-08-21 DIAGNOSIS — Z91041 Radiographic dye allergy status: Secondary | ICD-10-CM

## 2016-08-21 DIAGNOSIS — Z3A41 41 weeks gestation of pregnancy: Secondary | ICD-10-CM | POA: Diagnosis not present

## 2016-08-21 DIAGNOSIS — O480 Post-term pregnancy: Principal | ICD-10-CM | POA: Diagnosis present

## 2016-08-21 LAB — TYPE AND SCREEN
ABO/RH(D): B POS
Antibody Screen: NEGATIVE

## 2016-08-21 LAB — ABO/RH: ABO/RH(D): B POS

## 2016-08-21 LAB — CBC
HCT: 34.3 % — ABNORMAL LOW (ref 36.0–46.0)
Hemoglobin: 11.4 g/dL — ABNORMAL LOW (ref 12.0–15.0)
MCH: 28.1 pg (ref 26.0–34.0)
MCHC: 33.2 g/dL (ref 30.0–36.0)
MCV: 84.5 fL (ref 78.0–100.0)
Platelets: 214 10*3/uL (ref 150–400)
RBC: 4.06 MIL/uL (ref 3.87–5.11)
RDW: 14.6 % (ref 11.5–15.5)
WBC: 7.6 10*3/uL (ref 4.0–10.5)

## 2016-08-21 MED ORDER — LIDOCAINE HCL (PF) 1 % IJ SOLN
30.0000 mL | INTRAMUSCULAR | Status: AC | PRN
  Administered 2016-08-22: 30 mL via SUBCUTANEOUS
  Filled 2016-08-21: qty 30

## 2016-08-21 MED ORDER — PROMETHAZINE HCL 25 MG/ML IJ SOLN
12.5000 mg | Freq: Four times a day (QID) | INTRAMUSCULAR | Status: DC | PRN
  Administered 2016-08-22: 12.5 mg via INTRAVENOUS
  Filled 2016-08-21: qty 1

## 2016-08-21 MED ORDER — LACTATED RINGERS IV SOLN: 500.0000 mL | INTRAVENOUS | Status: DC | PRN

## 2016-08-21 MED ORDER — FLEET ENEMA 7-19 GM/118ML RE ENEM: 1.0000 | ENEMA | RECTAL | Status: DC | PRN

## 2016-08-21 MED ORDER — OXYCODONE-ACETAMINOPHEN 5-325 MG PO TABS: 2.0000 | ORAL_TABLET | ORAL | Status: DC | PRN

## 2016-08-21 MED ORDER — LACTATED RINGERS IV SOLN
INTRAVENOUS | Status: DC
  Administered 2016-08-21 – 2016-08-22 (×2): via INTRAVENOUS

## 2016-08-21 MED ORDER — ACETAMINOPHEN 325 MG PO TABS: 650.0000 mg | ORAL_TABLET | ORAL | Status: DC | PRN

## 2016-08-21 MED ORDER — OXYCODONE-ACETAMINOPHEN 5-325 MG PO TABS: 1.0000 | ORAL_TABLET | ORAL | Status: DC | PRN

## 2016-08-21 MED ORDER — OXYTOCIN 40 UNITS IN LACTATED RINGERS INFUSION - SIMPLE MED
2.5000 [IU]/h | INTRAVENOUS | Status: DC
  Administered 2016-08-22: 2.5 [IU]/h via INTRAVENOUS

## 2016-08-21 MED ORDER — SOD CITRATE-CITRIC ACID 500-334 MG/5ML PO SOLN: 30.0000 mL | ORAL | Status: DC | PRN

## 2016-08-21 MED ORDER — OXYTOCIN 40 UNITS IN LACTATED RINGERS INFUSION - SIMPLE MED
1.0000 m[IU]/min | INTRAVENOUS | Status: DC
  Administered 2016-08-21: 1 m[IU]/min via INTRAVENOUS
  Administered 2016-08-21: 20 m[IU]/min via INTRAVENOUS
  Filled 2016-08-21: qty 1000

## 2016-08-21 MED ORDER — FENTANYL CITRATE (PF) 100 MCG/2ML IJ SOLN
100.0000 ug | INTRAMUSCULAR | Status: DC | PRN
  Administered 2016-08-21 – 2016-08-22 (×4): 100 ug via INTRAVENOUS
  Filled 2016-08-21 (×4): qty 2

## 2016-08-21 MED ORDER — ONDANSETRON HCL 4 MG/2ML IJ SOLN
4.0000 mg | Freq: Four times a day (QID) | INTRAMUSCULAR | Status: DC | PRN
  Administered 2016-08-21 (×2): 4 mg via INTRAVENOUS
  Filled 2016-08-21 (×2): qty 2

## 2016-08-21 MED ORDER — OXYTOCIN BOLUS FROM INFUSION
500.0000 mL | Freq: Once | INTRAVENOUS | Status: AC
  Administered 2016-08-22: 500 mL via INTRAVENOUS

## 2016-08-21 MED ORDER — TERBUTALINE SULFATE 1 MG/ML IJ SOLN
0.2500 mg | Freq: Once | INTRAMUSCULAR | Status: DC | PRN
  Filled 2016-08-21: qty 1

## 2016-08-21 NOTE — Progress Notes (Signed)
  Subjective: Resting quietly--reports UCs stronger, some irregularity of quality and pattern.  Husband at bedside.  Objective: BP 109/60   Pulse 68   Temp 97.7 F (36.5 C) (Oral)   Resp 16   Ht 5\' 4"  (1.626 m)   Wt 76.7 kg (169 lb)   BMI 29.01 kg/m  No intake/output data recorded. No intake/output data recorded.   Vitals:   08/21/16 1007 08/21/16 1030 08/21/16 1031 08/21/16 1102  BP: 108/66 114/71  109/60  Pulse: 71 72  68  Resp: 17  16 16   Temp:      TempSrc:      Weight:      Height:        FHT: Category 1 UC:   irregular, every 2-5 minutes, mild SVE:   Dilation: 3 Effacement (%): 70, 80 Station: -2 Exam by:: Emilee HeroLatham, CNM at 8am Pitocin at 9 mu/min  Assessment:  Induction for post-dates GBS negative Favorable cervix  Plan: Continue pitocin induction. Recheck when contractions are better established, or prn.  Nigel BridgemanLATHAM, Apphia Cropley CNM 08/21/2016, 11:05 AM

## 2016-08-21 NOTE — Progress Notes (Signed)
  Subjective: Comfortable, no increase in intensity of UCs.  Objective: BP 123/67   Pulse 70   Temp 97.8 F (36.6 C) (Oral)   Resp 16   Ht 5\' 4"  (1.626 m)   Wt 76.7 kg (169 lb)   BMI 29.01 kg/m  No intake/output data recorded. No intake/output data recorded.  FHT: Category 1 UC:   regular, every 3 minutes SVE:   Dilation: 3 Effacement (%): 70, 80 Station: -2 Exam by:: Emilee HeroLatham, CNM at 0800 Pitocin at 15 mu/min  Assessment:  Induction for postdates Latent labor GBS negative  Plan: Continue current care. Evaluate around 3-3:30p for possible AROM.  Nigel BridgemanLATHAM, Evalynne Locurto CNM 08/21/2016, 2:08 PM

## 2016-08-21 NOTE — Anesthesia Pain Management Evaluation Note (Signed)
  CRNA Pain Management Visit Note  Patient: Laura Baxter, 26 y.o., female  "Hello I am a member of the anesthesia team at [facility]. We have an anesthesia team available at all times to provide care throughout the hospital, including epidural management and anesthesia for C-section. I don't know your plan for the delivery whether it a natural birth, water birth, IV sedation, nitrous supplementation, doula or epidural, but we want to meet your pain goals."   1.Was your pain managed to your expectations on prior hospitalizations?   Yes   2.What is your expectation for pain management during this hospitalization?     Labor support without medications  3.How can we help you reach that goal? support  Record the patient's initial score and the patient's pain goal.   Pain: 7/10  Pain Goal: less than 4 [facility] wants you to be able to say your pain was always managed very well.  Laura Baxter, Laura Baxter 08/21/2016

## 2016-08-21 NOTE — Progress Notes (Signed)
Laura MiuLisa Baxter MRN: [id number]  Subjective: -Patient resting in bed.  Reports increased pain and onset of intermittent rectal pressure.  Nurse reports patient with bout of nausea unrelieved by zofran.   Objective: BP 118/78   Pulse 85   Temp 97.7 F (36.5 C) (Oral)   Resp 18   Ht 5\' 4"  (1.626 m)   Wt 76.7 kg (169 lb)   BMI 29.01 kg/m  No intake/output data recorded. No intake/output data recorded.  Fetal Monitoring: FHT: 150 bpm, Mod Var, + Early Decels, +Accels UC: Q353min, palpates moderate    Vaginal Exam: SVE:   Dilation: 6.5 Effacement (%): 80 Station: 0 Exam by:: Sabas SousJ Rosselyn Martha, CNM Membranes:AROM Internal Monitors: None  Augmentation/Induction: Pitocin:1924mUn/min  Cytotec: None  Assessment:  IUP at 41.1wks Cat I FT  IOL Progressive Labor  Plan: -Patient requests to continue IV fentanyl dosing in hopes of avoiding epidural -Encouraged to utilize as long as coping well -Phenergan 12.5mg  IV for nausea -Continue other mgmt as ordered  Valma CavaJessica L Jacquie Lukes,MSN, CNM 08/21/2016, 11:33 PM

## 2016-08-21 NOTE — Progress Notes (Signed)
  Subjective: Sitting on [employer], playing board game with family.  Reports contractions stronger, but coping well.  Objective: BP 113/70   Pulse 70   Temp 97.7 F (36.5 C) (Oral)   Resp 18   Ht 5\' 4"  (1.626 m)   Wt 76.7 kg (169 lb)   BMI 29.01 kg/m  No intake/output data recorded. No intake/output data recorded.  FHT: Category 1 UC:   regular, every 4-5 minutes SVE:   Dilation: 4 Effacement (%): 70, 80 Station: -2 Exam by:: Manfred ArchV. Ande Therrell, CNM at 4p Pitocin at 18 mu/min Leaking clear fluid--AROM at 4pm  Assessment:  Induction for postdates Early labor GBS negative  Plan: Continue current care. VE deferred at present, due to no change in status.  Nigel BridgemanLATHAM, Zaydin Billey CNM 08/21/2016, 6:07 PM

## 2016-08-21 NOTE — Progress Notes (Addendum)
Laura MiuLisa Baxter MRN: [id number]  Subjective: -In room to assess and discuss POC for tonight.  Patient s/p fentanyl and asleep.  Provider did not attempt to awaken.  FOB, Sean, at bedside; denies needs at current.   Objective: BP 113/75   Pulse 69   Temp 97.7 F (36.5 C) (Oral)   Resp 18   Ht 5\' 4"  (1.626 m)   Wt 76.7 kg (169 lb)   BMI 29.01 kg/m  No intake/output data recorded. No intake/output data recorded.  Fetal Monitoring: FHT: 130 bpm, Mod Var, -Decels, +Accels UC: Q904min    Vaginal Exam: SVE:   Dilation: 4 Effacement (%): 70, 80 Station: -2 Exam by:: Mervin Kung Stewart, RN Membranes:AROM x 4 hrs Internal Monitors: None  Augmentation/Induction: Pitocin:2422mUn/min Cytotec: None  Assessment:  IUP at 41.1wks Cat I FT  IOL for Postdates GBS Negative S/P Fentanyl  Plan: -Will return to reassess at later interval -Continue other mgmt as ordered  Valma CavaJessica L Jania Steinke,MSN, CNM 08/21/2016, 8:00 PM  Addendum (2045) S: Nurse reports patient requesting pain medication dosing.  In room to discuss.  Patient reports coping well, but rating pain 6-7/10.  Patient states that inability to know delivery time is hard for her and is a significant factor in whether or not she will receive epidural.  O: FHT: 130 bpm, Mod Var, -Decels, +Accels UC: palpates moderate  SVE: Deferred  Pitocin remains at 8622mUn/min  A: Cat I FT IOL Requesting Fentanyl  P: Discussed fentanyl dosing including frequency and ceiling effect.  Encouraged movement to promote progessive labor and coping with labor pains.  Plan to recheck cervix at 2315 or earlier if necessary Continue other mgmt as ordered  Cherre RobinsJessica L Lennan Malone MSN, CNM 08/21/2016 9:25 PM

## 2016-08-21 NOTE — Progress Notes (Signed)
  Subjective: Comfortable, playing board game with FOB and family.  Objective: BP (!) 111/59   Pulse 79   Temp 98.5 F (36.9 C) (Oral)   Resp 16   Ht 5\' 4"  (1.626 m)   Wt 76.7 kg (169 lb)   BMI 29.01 kg/m  No intake/output data recorded. No intake/output data recorded.  FHT: Category 1 UC:   regular, every 3 minutes SVE:   Dilation: 4 Effacement (%): 70, 80 Station: -2 Exam by:: Laura Baxter, CNM Pitocin at 17 mu/min  AROM, clear fluid, copious amount  Assessment:  Induction for post-dates Early labor, now s/p AROM GBS negative  Plan: Continue current plan Pain med prn.  Laura Baxter, Laura Baxter CNM 08/21/2016, 3:52 PM

## 2016-08-22 ENCOUNTER — Encounter (HOSPITAL_COMMUNITY): Payer: Self-pay

## 2016-08-22 LAB — RPR: RPR Ser Ql: NONREACTIVE

## 2016-08-22 MED ORDER — PRENATAL MULTIVITAMIN CH
1.0000 | ORAL_TABLET | Freq: Every day | ORAL | Status: DC
  Administered 2016-08-22 – 2016-08-24 (×3): 1 via ORAL
  Filled 2016-08-22 (×3): qty 1

## 2016-08-22 MED ORDER — ACETAMINOPHEN 325 MG PO TABS: 650.0000 mg | ORAL_TABLET | ORAL | Status: DC | PRN

## 2016-08-22 MED ORDER — WITCH HAZEL-GLYCERIN EX PADS: 1.0000 "application " | MEDICATED_PAD | CUTANEOUS | Status: DC | PRN

## 2016-08-22 MED ORDER — SENNOSIDES-DOCUSATE SODIUM 8.6-50 MG PO TABS
2.0000 | ORAL_TABLET | ORAL | Status: DC
  Administered 2016-08-23 (×2): 2 via ORAL
  Filled 2016-08-22 (×2): qty 2

## 2016-08-22 MED ORDER — SIMETHICONE 80 MG PO CHEW: 80.0000 mg | CHEWABLE_TABLET | ORAL | Status: DC | PRN

## 2016-08-22 MED ORDER — COCONUT OIL OIL
1.0000 "application " | TOPICAL_OIL | Status: DC | PRN
  Administered 2016-08-23: 1 via TOPICAL
  Filled 2016-08-22: qty 120

## 2016-08-22 MED ORDER — ONDANSETRON HCL 4 MG PO TABS: 4.0000 mg | ORAL_TABLET | ORAL | Status: DC | PRN

## 2016-08-22 MED ORDER — BENZOCAINE-MENTHOL 20-0.5 % EX AERO
1.0000 "application " | INHALATION_SPRAY | CUTANEOUS | Status: DC | PRN
  Administered 2016-08-22: 1 via TOPICAL
  Filled 2016-08-22: qty 56

## 2016-08-22 MED ORDER — ZOLPIDEM TARTRATE 5 MG PO TABS
5.0000 mg | ORAL_TABLET | Freq: Every evening | ORAL | Status: DC | PRN
Start: 2016-08-22 — End: 2016-08-24

## 2016-08-22 MED ORDER — DIPHENHYDRAMINE HCL 25 MG PO CAPS: 25.0000 mg | ORAL_CAPSULE | Freq: Four times a day (QID) | ORAL | Status: DC | PRN

## 2016-08-22 MED ORDER — IBUPROFEN 600 MG PO TABS
600.0000 mg | ORAL_TABLET | Freq: Four times a day (QID) | ORAL | Status: DC
  Administered 2016-08-22 – 2016-08-24 (×10): 600 mg via ORAL
  Filled 2016-08-22 (×10): qty 1

## 2016-08-22 MED ORDER — TETANUS-DIPHTH-ACELL PERTUSSIS 5-2.5-18.5 LF-MCG/0.5 IM SUSP: 0.5000 mL | Freq: Once | INTRAMUSCULAR | Status: DC

## 2016-08-22 MED ORDER — ONDANSETRON HCL 4 MG/2ML IJ SOLN: 4.0000 mg | INTRAMUSCULAR | Status: DC | PRN

## 2016-08-22 MED ORDER — DIBUCAINE 1 % RE OINT: 1.0000 "application " | TOPICAL_OINTMENT | RECTAL | Status: DC | PRN

## 2016-08-22 NOTE — Lactation Note (Signed)
This note was copied from a baby's chart. Lactation Consultation Note  Patient Name: Girl Otilio MiuLisa Grigley ZOXWR'UToday's Date: 08/22/2016 Reason for consult: Initial assessment  Visited with 1st time Mom, baby 9 hrs old.  Baby having hearing screen in CN.  Mom states she has gotten assistance with positioning and latching, and that baby latches deeply without discomfort.  Mom choosing to use formula along with breastfeeding due to her going to  [employer] this fall.  Baby has had one bottle feeding of 15 ml.  Talked to Mom about the benefits of exclusive breastfeeding with regards to her milk supply.  Explained that she can offer bottles 3-4 weeks from now with less affect.  I recommended she keep volume to 5-7 ml and to always breast feed first before offering bottle.  Mom aware of hand expression, and breast massage as beneficial to milk supply.  Reviewed basics with Mom.  Encouraged STS and feeding often on cue, goal of 8-12 feedings per 24 hrs.  Brochure given to Mom.  Mom aware of IP and OP lactation services available to her.  Encouraged her to call prn. Consult Status Consult Status: Follow-up Date: 08/23/16 Follow-up type: In-patient    Judee ClaraSmith, Antonia Culbertson E 08/22/2016, 1:57 PM

## 2016-08-23 LAB — CBC
HCT: 34.5 % — ABNORMAL LOW (ref 36.0–46.0)
Hemoglobin: 11.4 g/dL — ABNORMAL LOW (ref 12.0–15.0)
MCH: 27.9 pg (ref 26.0–34.0)
MCHC: 33 g/dL (ref 30.0–36.0)
MCV: 84.4 fL (ref 78.0–100.0)
Platelets: 183 10*3/uL (ref 150–400)
RBC: 4.09 MIL/uL (ref 3.87–5.11)
RDW: 14.4 % (ref 11.5–15.5)
WBC: 10.9 10*3/uL — ABNORMAL HIGH (ref 4.0–10.5)

## 2016-08-23 NOTE — Progress Notes (Signed)
Post Partum Day 1 Subjective:  Well. Lochia are normal. Voiding, ambulating, tolerating normal diet. feeding or nursing going well.  Objective: Blood pressure 106/61, pulse 68, temperature 98 F (36.7 C), temperature source Oral, resp. rate 18, height 5\' 4"  (1.626 m), weight 169 lb (76.7 kg), unknown if currently breastfeeding.  Physical Exam:  General: normal Lochia: appropriate Uterine Fundus: AU firm non-tender  Extremities: No evidence of DVT seen on physical exam. Edema none     Recent Labs  08/21/16 0750 08/23/16 0523  HGB 11.4* 11.4*  HCT 34.3* 34.5*    Assessment/Plan: Normal Post-partum. Continue routine post-partum care. Anticipate discharge tomorrow    LOS: 2 days   Elmore GuiseLori A Kaelan Emami CNM 08/23/2016, 11:14 AM

## 2016-08-23 NOTE — Plan of Care (Signed)
Problem: Bowel/Gastric: Goal: Will not experience complications related to bowel motility Outcome: Progressing Active bowel sounds, pt is ambulatory.

## 2016-08-24 MED ORDER — IBUPROFEN 600 MG PO TABS: 600.0000 mg | ORAL_TABLET | Freq: Four times a day (QID) | ORAL | 0 refills | Status: DC

## 2016-08-24 NOTE — Discharge Summary (Signed)
Obstetric Discharge Summary Reason for Admission: induction of labor postdates Prenatal Procedures: none Intrapartum Procedures: spontaneous vaginal delivery Postpartum Procedures: none Complications-Operative and Postpartum: 1 degree perineal laceration Operative Delivery Note At 2:16 AM, on August 22, 2016, a viable female "Laura Baxter" was delivered via Vaginal, Spontaneous Delivery.  Presentation: vertex; Position: Left,, Occiput,, Anterior with manual restitution to LOT. After delivery of head, shoulders dystocia noted and maneuvers implemented as below resulting in release of anterior shoulder and delivery of infant.  Time from delivery of head to delivery of body less than 40 seconds.  Infant with good tone and tactile stimulation and bulb suction given by provider to stimulate grimace. Infant placed on mother's abdomen where nurse's continued tactile stimulation.  Infant APGAR: 8, 9.  Cord clamped, cut, and blood collected. Placenta delivered spontaneously and noted to be intact with 3VC upon inspection.  Vaginal inspection revealed a 1st degree vaginal laceration and bilateral labial lacerations that was repaired with 3-0 vicryl on CT-1 and SH, respectively.  Patient also with hemostatic periurethral laceration that was not repaired.  Anesthetic necessary and patient given ~2315mL of lidocaine.  Patient tolerated the procedure well. Fundus firm, at the umbilicus, and bleeding small.  Mother hemodynamically stable and infant skin to skin, with father, prior to provider exit.  Mother desires IUD for birth control and opts to breast and bottle feed.  Infant weight at one hour of life: 7lbs 8oz, 19.75 in  Delivery of the head: 08/22/2016  2:16 AM First maneuver: 08/22/2016  2:16 AM, McRoberts Second maneuver: 08/22/2016  2:16 AM, Suprapubic Pressure Third maneuver: ,   Fourth maneuver: ,   Fifth maneuver: ,   Sixth maneuver: ,    Anesthesia:  Local for Repair Episiotomy: None Lacerations:  Bilateral Labial, 1st Degree Vaginal, and Hemostatic Periuretheral Suture Repair: 3.0 vicryl Est. Blood Loss (mL):  300  Mom to postpartum.  Baby to Couplet care / Skin to Skin.  Cherre RobinsJessica L Emly MSN, CNM 08/22/2016, 3:09 AM   Hospital Course:  Active Problems:   Post-dates pregnancy   SVD (spontaneous vaginal delivery)   Obstetric labial laceration, delivered, current hospitalization   Obstetric vaginal laceration   Periurethral laceration, delivered, current hospitalization   Shoulder dystocia, delivered, current hospitalization   Laura Baxter is a 26 y.o. G2P1011 s/p vaginal delivery.  Patient was admitted 08/21/16.  She has postpartum course that was uncomplicated including no problems with ambulating, PO intake, urination, pain, or bleeding. The pt feels ready to go home and  will be discharged with outpatient follow-up.   Today: No acute events overnight.  Pt denies problems with ambulating, voiding or po intake.  She denies nausea or vomiting.  Pain is well controlled.  She  flatus. Lochia Small.  Plan for birth control is IUD.  Method of Feeding: breast  Physical Exam:  BP 104/60   Pulse 67   Temp 98 F (36.7 C)   Resp 12   Ht 5\' 4"  (1.626 m)   Wt 169 lb (76.7 kg)   Breastfeeding? Unknown   BMI 29.01 kg/m  General: alert and cooperative Lochia: appropriate Uterine Fundus: firm Incision:  DVT Evaluation: No evidence of DVT seen on physical exam.  H/H: Lab Results  Component Value Date/Time   HGB 11.4 (L) 08/23/2016 05:23 AM   HCT 34.5 (L) 08/23/2016 05:23 AM    Discharge Diagnoses: Term Pregnancy-delivered  Discharge Information: Date: 08/24/2016 Activity: pelvic rest Diet: routine  Medications: PNV, Ibuprofen and Iron Breast feeding:  Yes Condition:  stable Instructions: refer to handout Discharge to: home   Discharge Instructions    Activity as tolerated    Complete by:  As directed    Call MD for:    Complete by:  As directed    Call MD for:   difficulty breathing, headache or visual disturbances    Complete by:  As directed    Call MD for:  hives    Complete by:  As directed    Call MD for:  persistant dizziness or light-headedness    Complete by:  As directed    Call MD for:  persistant nausea and vomiting    Complete by:  As directed    Call MD for:  severe uncontrolled pain    Complete by:  As directed    Call MD for:  temperature >100.4    Complete by:  As directed    Diet - low sodium heart healthy    Complete by:  As directed    Discharge instructions    Complete by:  As directed    routine   Sexual acrtivity    Complete by:  As directed    6 weeks     Allergies as of 08/24/2016      Reactions   Iodine Anaphylaxis   Shellfish Allergy Anaphylaxis      Medication List    STOP taking these medications   promethazine 12.5 MG tablet Commonly known as:  PHENERGAN     TAKE these medications   ibuprofen 600 MG tablet Commonly known as:  ADVIL,MOTRIN Take 1 tablet (600 mg total) by mouth every 6 (six) hours.   IRON PO Take 1 tablet by mouth every morning.   prenatal multivitamin Tabs tablet Take 1 tablet by mouth daily at 12 noon.      Follow-up Information    [facility]. Schedule an appointment as soon as possible for a visit in 6 week(s).   Specialty:  Obstetrics and Gynecology Why:  Tell office that you would like to have a Paraguard IUD Contact information: [address] [place] [address] [phone]           Rhea Pink, CNM 08/24/2016,11:19 AM

## 2016-08-24 NOTE — Discharge Instructions (Signed)

## 2016-08-24 NOTE — Lactation Note (Signed)
This note was copied from a baby's chart. Lactation Consultation Note  Patient Name: Laura Baxter JYNWG'NToday's Date: 08/24/2016 Reason for consult: Follow-up assessment;Breast/nipple pain Mom is breast/bottle feeding by choice. Mom c/o sore nipples with some cracking in left nipple. LC offered to assist with latch before d/c home today but Mom declined. Care for sore nipples reviewed with Mom. Advised to apply EBM/ comfort gels given with instructions. Encouraged to BF with each feeding before giving any bottles. Advised baby needs to be at breast 8-12 time in 24 hours and with feeding ques. Discussed with Mom how early supplementation with bottle can affect milk coming to volume, latch and increased risk for engorgement. Engorgement care reviewed if needed. Advised of OP services and encouraged to schedule OP f/u if nipple pain/trauma does not improve. Mom reports the pain is with initial latch but improves with baby nursing. Advised of support group. Encouraged Mom to call if she decides she wants help with latch before d/c today.   Maternal Data    Feeding    LATCH Score/Interventions                      Lactation Tools Discussed/Used Tools: Comfort gels   Consult Status Consult Status: Complete Date: 08/24/16 Follow-up type: In-patient    Alfred LevinsGranger, Charlet Harr Ann 08/24/2016, 12:07 PM

## 2016-12-22 ENCOUNTER — Encounter: Payer: Self-pay | Admitting: Endocrinology

## 2017-01-29 ENCOUNTER — Telehealth: Payer: Self-pay | Admitting: Endocrinology

## 2017-01-29 NOTE — Telephone Encounter (Signed)
Pt stated she has appt but wants to make sure recent labs are here.  [facility] [employer] had faxed them over to us.   Please advise

## 2017-01-29 NOTE — Telephone Encounter (Signed)
Left patient a VM stating that I had received VIA fax her paperwork. I put it on Dr. George HughEllison's desk for review.

## 2017-02-10 ENCOUNTER — Encounter: Payer: Self-pay | Admitting: Endocrinology

## 2017-02-10 ENCOUNTER — Ambulatory Visit (INDEPENDENT_AMBULATORY_CARE_PROVIDER_SITE_OTHER): Payer: No Typology Code available for payment source | Admitting: Endocrinology

## 2017-02-10 ENCOUNTER — Other Ambulatory Visit (INDEPENDENT_AMBULATORY_CARE_PROVIDER_SITE_OTHER): Payer: No Typology Code available for payment source

## 2017-02-10 DIAGNOSIS — E039 Hypothyroidism, unspecified: Secondary | ICD-10-CM

## 2017-02-10 LAB — TSH: TSH: 11.08 u[IU]/mL — ABNORMAL HIGH (ref 0.35–4.50)

## 2017-02-10 LAB — T4, FREE: Free T4: 0.64 ng/dL (ref 0.60–1.60)

## 2017-02-10 MED ORDER — LEVOTHYROXINE SODIUM 75 MCG PO TABS: 75.0000 ug | ORAL_TABLET | Freq: Every day | ORAL | 3 refills | Status: DC

## 2017-02-10 NOTE — Patient Instructions (Addendum)
blood tests are requested for you today.  We'll let you know about the results.  In view of your medical condition, you should avoid pregnancy until we have decided it is safe.  The next time the thyroid is high, we can do the radioactive iodine if you want to.   Please come back for a follow-up appointment in 3 months.

## 2017-02-10 NOTE — Progress Notes (Signed)
Subjective:    Patient ID: Laura MiuLisa Baxter, female    DOB: [birth year], 26 y.o.   MRN: [id number]  HPI Pt is self-referred, for hyperthyroidism.  Pt reports he was dx'ed with hyperthyroidism in 2015.  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  She was rx'ed with synthroid.  She was able to d/c this is 2016, and stayed euthyroid off rx until 2 mos ago, when she again presented with hyperthyroidism;  She then evolved hypothyroidism without any rx, synthroid was again rx'ed.  she has never had XRT to the anterior neck, or thyroid surgery.  she does not consume kelp or any other non-prescribed thyroid medication.  He has never been on amiodarone.  She reports slight swelling at the ant neck, and assoc pain.  She is 5 mos postpartum.   Past Medical History:  Diagnosis Date  . Allergy   . Anemia   . Infection    UTI  . Leg fracture    shin, ? left  . Low BP   . Ovarian cyst   . Seizures (HCC)    febrile as infant  . Wrist fracture    right    Past Surgical History:  Procedure Laterality Date  . SHOULDER SURGERY Left     Social History   Socioeconomic History  . Marital status: Married    Spouse name: Not on file  . Number of children: Not on file  . Years of education: Not on file  . Highest education level: Not on file  Social Needs  . Financial resource strain: Not on file  . Food insecurity - worry: Not on file  . Food insecurity - inability: Not on file  . Transportation needs - medical: Not on file  . Transportation needs - non-medical: Not on file  Occupational History  . Not on file  Tobacco Use  . Smoking status: Never Smoker  . Smokeless tobacco: Never Used  Substance and Sexual Activity  . Alcohol use: No  . Drug use: No  . Sexual activity: Not on file  Other Topics Concern  . Not on file  Social History Narrative  . Not on file    Current Outpatient Medications on File Prior to Visit  Medication Sig Dispense  Refill  . IRON PO Take 1 tablet by mouth every morning.    Marland Kitchen. ibuprofen (ADVIL,MOTRIN) 600 MG tablet Take 1 tablet (600 mg total) by mouth every 6 (six) hours. (Patient not taking: Reported on 02/10/2017) 30 tablet 0  . Prenatal Vit-Fe Fumarate-FA (PRENATAL MULTIVITAMIN) TABS tablet Take 1 tablet by mouth daily at 12 noon.     No current facility-administered medications on file prior to visit.     Allergies  Allergen Reactions  . Iodine Anaphylaxis  . Shellfish Allergy Anaphylaxis    Family History  Problem Relation Age of Onset  . Depression Mother   . Anemia Mother   . Heart attack Father   . Hypertension Father   . Hyperlipidemia Father   . Heart disease Father   . Depression Father   . Glaucoma Father   . Graves' disease Father   . Thyroid disease Father   . Cancer Maternal Grandmother        pancreatic  . Breast cancer Paternal Grandmother   . Leukemia Paternal Grandmother   . Leukemia Paternal Grandfather     BP 112/82 (BP Location: Left Arm, Patient Position: Sitting, Cuff Size: Normal)   Pulse 94  Wt 153 lb 6.4 oz (69.6 kg)   SpO2 97%   BMI 26.33 kg/m    Review of Systems denies depression, hair loss, muscle cramps, sob, weight gain, constipation, numbness, diplopia, easy bruising, and syncope.  She has dry skin, cold intolerance, myalgias, and rhinorrhea.      Objective:   Physical Exam VS: see vs page GEN: no distress HEAD: head: no deformity eyes: no periorbital swelling, no proptosis external nose and ears are normal mouth: no lesion seen NECK: thyroid is slightly and diffusely enlarged. CHEST WALL: no deformity LUNGS: clear to auscultation CV: reg rate and rhythm, no murmur ABD: abdomen is soft, nontender.  no hepatosplenomegaly.  not distended.  no hernia MUSCULOSKELETAL: muscle bulk and strength are grossly normal.  no obvious joint swelling.  gait is normal and steady EXTEMITIES: no deformity.  no edema PULSES: no carotid bruit NEURO:  cn  2-12 grossly intact.   readily moves all 4's.  sensation is intact to touch on all 4's.  No tremor. SKIN:  Normal texture and temperature.  No rash or suspicious lesion is visible.  Not diaphoretic NODES:  None palpable at the neck PSYCH: alert, well-oriented.  Does not appear anxious nor depressed.  I have reviewed outside records, and summarized: Pt referred herself here.  She was noted in 11/18 to have hyperthyroidism.  It was followed without rx.  She then spontaneously evolved hypothyroidism later in 11/18, so she was started on Synthroid  outside test results are reviewed: TPO antibodies are pos  US: heterogenous tissue     Assessment & Plan:  Autoimmune thyroid dz, with fluctuating TFT.   Patient Instructions  blood tests are requested for you today.  We'll let you know about the results.  In view of your medical condition, you should avoid pregnancy until we have decided it is safe.  The next time the thyroid is high, we can do the radioactive iodine if you want to.   Please come back for a follow-up appointment in 3 months.

## 2017-03-04 ENCOUNTER — Encounter: Payer: Self-pay | Admitting: Endocrinology

## 2017-03-12 ENCOUNTER — Other Ambulatory Visit: Payer: Self-pay | Admitting: Endocrinology

## 2017-03-12 ENCOUNTER — Other Ambulatory Visit (INDEPENDENT_AMBULATORY_CARE_PROVIDER_SITE_OTHER): Payer: BLUE CROSS/BLUE SHIELD

## 2017-03-12 DIAGNOSIS — E039 Hypothyroidism, unspecified: Secondary | ICD-10-CM

## 2017-03-12 LAB — TSH: TSH: 5.95 u[IU]/mL — ABNORMAL HIGH (ref 0.35–4.50)

## 2017-03-12 MED ORDER — LEVOTHYROXINE SODIUM 100 MCG PO TABS: 100.0000 ug | ORAL_TABLET | Freq: Every day | ORAL | 5 refills | Status: DC

## 2017-04-06 DIAGNOSIS — Z3040 Encounter for surveillance of contraceptives, unspecified: Secondary | ICD-10-CM | POA: Diagnosis not present

## 2017-04-06 DIAGNOSIS — Z113 Encounter for screening for infections with a predominantly sexual mode of transmission: Secondary | ICD-10-CM | POA: Diagnosis not present

## 2017-04-13 ENCOUNTER — Telehealth: Payer: Self-pay | Admitting: Endocrinology

## 2017-04-13 ENCOUNTER — Other Ambulatory Visit (INDEPENDENT_AMBULATORY_CARE_PROVIDER_SITE_OTHER): Payer: BLUE CROSS/BLUE SHIELD

## 2017-04-13 ENCOUNTER — Other Ambulatory Visit: Payer: Self-pay

## 2017-04-13 DIAGNOSIS — E039 Hypothyroidism, unspecified: Secondary | ICD-10-CM

## 2017-04-13 LAB — TSH: TSH: 0.97 u[IU]/mL (ref 0.35–4.50)

## 2017-04-13 NOTE — Telephone Encounter (Signed)
Patient is requesting to to come today to get Labs done due to increase in medication dosage. Can I make her a lab appt today (no orders for labs showing up)

## 2017-04-13 NOTE — Telephone Encounter (Signed)
Patient is requesting to to come today to get Labs done due to increase in medication dosage. Can I make her a lab appt today (no orders for labs showing up)  °

## 2017-04-13 NOTE — Telephone Encounter (Signed)
I have called patient & made lab appt.

## 2017-04-13 NOTE — Telephone Encounter (Signed)
I ordered.  Please come in to have drawn

## 2017-05-08 DIAGNOSIS — L0501 Pilonidal cyst with abscess: Secondary | ICD-10-CM | POA: Diagnosis not present

## 2017-05-11 ENCOUNTER — Ambulatory Visit (INDEPENDENT_AMBULATORY_CARE_PROVIDER_SITE_OTHER): Payer: BLUE CROSS/BLUE SHIELD | Admitting: Endocrinology

## 2017-05-11 ENCOUNTER — Encounter: Payer: Self-pay | Admitting: Endocrinology

## 2017-05-11 VITALS — BP 110/70 | HR 77 | Ht 64.0 in | Wt 156.0 lb

## 2017-05-11 DIAGNOSIS — E039 Hypothyroidism, unspecified: Secondary | ICD-10-CM | POA: Diagnosis not present

## 2017-05-11 NOTE — Progress Notes (Signed)
   Subjective:    Patient ID: Laura Baxter, female    DOB: 05-28-1990, 28 y.o.   MRN: [id number]  HPI    Review of Systems     Objective:   Physical Exam        Assessment & Plan:

## 2017-05-11 NOTE — Progress Notes (Signed)
Subjective:    Patient ID: Laura Baxter, female    DOB: 1991/02/24, 27 y.o.   MRN: [id number]  HPI Pt returns for f/u of hyperthyroidism (dx'ed 2015; she cycled twice between hyper- and hypothyroidism until 2018, when she developed her current hypothyroidism; she takes synthroid; US showed thyroiditis). She is 8 mos postpartum.  She has difficulty with concentration and lightheadedness.  She is not breast feeding.   Past Medical History:  Diagnosis Date  . Allergy   . Anemia   . Infection    UTI  . Leg fracture    shin, ? left  . Low BP   . Ovarian cyst   . Seizures (HCC)    febrile as infant  . Wrist fracture    right    Past Surgical History:  Procedure Laterality Date  . SHOULDER SURGERY Left     Social History   Socioeconomic History  . Marital status: Married    Spouse name: Not on file  . Number of children: Not on file  . Years of education: Not on file  . Highest education level: Not on file  Social Needs  . Financial resource strain: Not on file  . Food insecurity - worry: Not on file  . Food insecurity - inability: Not on file  . Transportation needs - medical: Not on file  . Transportation needs - non-medical: Not on file  Occupational History  . Not on file  Tobacco Use  . Smoking status: Never Smoker  . Smokeless tobacco: Never Used  Substance and Sexual Activity  . Alcohol use: No  . Drug use: No  . Sexual activity: Not on file  Other Topics Concern  . Not on file  Social History Narrative  . Not on file    Current Outpatient Medications on File Prior to Visit  Medication Sig Dispense Refill  . ibuprofen (ADVIL,MOTRIN) 600 MG tablet Take 1 tablet (600 mg total) by mouth every 6 (six) hours. 30 tablet 0  . IRON PO Take 1 tablet by mouth every morning.    Marland Kitchen. levothyroxine (SYNTHROID, LEVOTHROID) 100 MCG tablet Take 1 tablet (100 mcg total) by mouth daily before breakfast. 30 tablet 5  . Prenatal Vit-Fe Fumarate-FA (PRENATAL MULTIVITAMIN) TABS  tablet Take 1 tablet by mouth daily at 12 noon.     No current facility-administered medications on file prior to visit.     Allergies  Allergen Reactions  . Iodine Anaphylaxis  . Shellfish Allergy Anaphylaxis    Family History  Problem Relation Age of Onset  . Depression Mother   . Anemia Mother   . Heart attack Father   . Hypertension Father   . Hyperlipidemia Father   . Heart disease Father   . Depression Father   . Glaucoma Father   . Graves' disease Father   . Thyroid disease Father   . Cancer Maternal Grandmother        pancreatic  . Breast cancer Paternal Grandmother   . Leukemia Paternal Grandmother   . Leukemia Paternal Grandfather     BP 110/70   Pulse 77   Ht 5\' 4"  (1.626 m)   Wt 156 lb (70.8 kg)   SpO2 98%   BMI 26.78 kg/m    Review of Systems Denies palpitations.      Objective:   Physical Exam VITAL SIGNS:  See vs page GENERAL: no distress NECK: thyroid is slightly enlarged, with irreg surface, but no palpable nodule  Assessment & Plan:  Hypothyroidism, due to lymphocytic thyroiditis.  We discussed.  she wants to consider surgery.    Patient Instructions  blood tests are requested for you today.  We'll let you know about the results.  Please come back for a follow-up appointment in 6 months, or sooner if a pregnancy happens.   Also, please call or message [place] if you think the thyroid might be off, so we can recheck then.   Please see a surgery specialist.  you will receive a phone call, about a day and time for an appointment

## 2017-05-11 NOTE — Patient Instructions (Addendum)
blood tests are requested for you today.  We'll let you know about the results.  Please come back for a follow-up appointment in 6 months, or sooner if a pregnancy happens.   Also, please call or message us if you think the thyroid might be off, so we can recheck then.   Please see a surgery specialist.  you will receive a phone call, about a day and time for an appointment

## 2017-05-12 ENCOUNTER — Ambulatory Visit: Payer: Self-pay | Admitting: General Surgery

## 2017-05-12 LAB — T4, FREE: Free T4: 1.06 ng/dL (ref 0.60–1.60)

## 2017-05-12 LAB — TSH: TSH: 2.98 u[IU]/mL (ref 0.35–4.50)

## 2017-05-21 ENCOUNTER — Other Ambulatory Visit (HOSPITAL_COMMUNITY): Payer: Self-pay | Admitting: Obstetrics & Gynecology

## 2017-05-21 DIAGNOSIS — R102 Pelvic and perineal pain: Secondary | ICD-10-CM

## 2017-05-21 DIAGNOSIS — M545 Low back pain: Secondary | ICD-10-CM | POA: Diagnosis not present

## 2017-05-21 DIAGNOSIS — Z30431 Encounter for routine checking of intrauterine contraceptive device: Secondary | ICD-10-CM | POA: Diagnosis not present

## 2017-05-28 ENCOUNTER — Ambulatory Visit (HOSPITAL_COMMUNITY): Payer: BLUE CROSS/BLUE SHIELD

## 2017-05-29 ENCOUNTER — Ambulatory Visit (HOSPITAL_COMMUNITY)
Admission: RE | Admit: 2017-05-29 | Discharge: 2017-05-29 | Disposition: A | Discharge status: Home / Self Care | Payer: BLUE CROSS/BLUE SHIELD | Source: Ambulatory Visit | Attending: Obstetrics & Gynecology | Admitting: Obstetrics & Gynecology

## 2017-05-29 ENCOUNTER — Telehealth: Payer: Self-pay | Admitting: Endocrinology

## 2017-05-29 DIAGNOSIS — N83292 Other ovarian cyst, left side: Secondary | ICD-10-CM | POA: Diagnosis not present

## 2017-05-29 DIAGNOSIS — R102 Pelvic and perineal pain: Secondary | ICD-10-CM | POA: Diagnosis not present

## 2017-05-29 DIAGNOSIS — Z975 Presence of (intrauterine) contraceptive device: Secondary | ICD-10-CM | POA: Diagnosis not present

## 2017-05-29 NOTE — Telephone Encounter (Signed)
Patient has been calling to check the status of this referral that was done back on 3/18. There are no notes regarding this & patient is concerned.

## 2017-05-29 NOTE — Telephone Encounter (Signed)
Patient is calling about referral that doctor ellison sent in march. She would like to know when she should be expecting a call back since she has not heard from anyone about scheduling.   Thanks!

## 2017-06-01 DIAGNOSIS — N83202 Unspecified ovarian cyst, left side: Secondary | ICD-10-CM | POA: Diagnosis not present

## 2017-06-01 DIAGNOSIS — R102 Pelvic and perineal pain: Secondary | ICD-10-CM | POA: Diagnosis not present

## 2017-06-04 ENCOUNTER — Telehealth: Payer: Self-pay | Admitting: Endocrinology

## 2017-06-04 ENCOUNTER — Encounter (HOSPITAL_BASED_OUTPATIENT_CLINIC_OR_DEPARTMENT_OTHER): Payer: Self-pay | Admitting: *Deleted

## 2017-06-04 NOTE — Telephone Encounter (Signed)
Per [facility] Call [facility]-Patient wants to office to call her re: referral from Dr. Everardo AllEllison ph# [phone]

## 2017-06-04 NOTE — Telephone Encounter (Signed)
I called & left detailed VM that we have printed off referral to be sent to [facility]. We are waiting on their referral coordinator & when we hear from their office I can fax over our referral so patient can get scheduled.

## 2017-06-05 ENCOUNTER — Encounter (HOSPITAL_BASED_OUTPATIENT_CLINIC_OR_DEPARTMENT_OTHER): Payer: Self-pay | Admitting: *Deleted

## 2017-06-05 ENCOUNTER — Other Ambulatory Visit: Payer: Self-pay

## 2017-06-05 DIAGNOSIS — E039 Hypothyroidism, unspecified: Secondary | ICD-10-CM | POA: Diagnosis not present

## 2017-06-05 DIAGNOSIS — Z79890 Hormone replacement therapy (postmenopausal): Secondary | ICD-10-CM | POA: Diagnosis not present

## 2017-06-05 DIAGNOSIS — L0591 Pilonidal cyst without abscess: Secondary | ICD-10-CM | POA: Diagnosis not present

## 2017-06-05 NOTE — Progress Notes (Signed)
SPOKE W/ PT VIA PHONE FOR PRE-OP INTERVIEW.  ARRIVE AT [id number].  NEEDS URINE PREG.   NPO AFTER MN W/ EXCEPTION CLEAR LIQUIDS UNTIL 0715, AT WHICH WILL FINISH ENSURE PRE-SURGERY DRINK.  WILL HIBICLENS SHOWER HS BEFORE AND AM DOS.  PT VERBALIZED UNDERSTANDING AND WILL PICK-UP DRINK AND HIBICLENS WITH WRITTEN INSTRUCTIONS , Tuesday 06-16-2017 @ 1000 AT [facility] PST.

## 2017-06-05 NOTE — Progress Notes (Addendum)
Your procedure is scheduled on  __04-24-2019_________  [facility] AT   8:15 A. M._____   Call this number if you have problems the morning of surgery  [phone].   OUR ADDRESS IS [address].  WE ARE LOCATED IN [place]                                   [place].                                     REMEMBER:  DO NOT EAT FOOD AFTER MIDNIGHT WITH EXCEPTION CLEAR LIQUID DIET UNTIL 7:15 AM. (INSTRUCTIONS BELOW) TAKE THESE MEDICATIONS MORNING OF SURGERY WITH A SIP OF WATER:  _______SYNTHROID_______________                                    DO NOT WEAR JEWERLY, MAKE UP, OR NAIL POLISH,  DO NOT WEAR LOTIONS, POWDERS, PERFUMES OR DEODORANT. DO NOT SHAVE FOR 24 HOURS PRIOR TO DAY OF SURGERY.  CONTACTS, GLASSES, OR DENTURES MAY NOT BE WORN TO SURGERY.                                    [facility] IS NOT RESPONSIBLE  FOR ANY BELONGINGS.                                                                    Marland Kitchen.                                                                                                    [facility] - Preparing for Surgery Before surgery, you can play an important role.  Because skin is not sterile, your skin needs to be as free of germs as possible.  You can reduce the number of germs on your skin by washing with Hibiclens/ CHG (chlorahexidine gluconate) soap before surgery.  CHG is an antiseptic cleaner which kills germs and bonds with the skin to continue killing germs even after washing. Please DO NOT use if you have an allergy to CHG or antibacterial soaps.  If your skin becomes reddened/irritated stop using the CHG and inform your nurse when you arrive at Short Stay. Do not shave (including legs and underarms) for at least 48 hours prior to the first CHG shower.  You may shave your face/neck. Please follow these instructions carefully:  1.  Shower with CHG Soap the night before surgery and the  morning of Surgery.  2.  If you  choose to wash your hair, wash your hair first as usual with your  normal  shampoo.  3.  After you shampoo, rinse your hair and body thoroughly to remove the  shampoo.                           4.  Use CHG as you would any other liquid soap.  You can apply chg directly  to the skin and wash                       Gently with a scrungie or clean washcloth.  5.  Apply the CHG Soap to your body ONLY FROM THE NECK DOWN.   Do not use on face/ open                           Wound or open sores. Avoid contact with eyes, ears mouth and genitals (private parts).                       Wash face,  Genitals (private parts) with your normal soap.             6.  Wash thoroughly, paying special attention to the area where your surgery  will be performed.  7.  Thoroughly rinse your body with warm water from the neck down.  8.  DO NOT shower/wash with your normal soap after using and rinsing off  the CHG Soap.                9.  Pat yourself dry with a clean towel.            10.  Wear clean pajamas.            11.  Place clean sheets on your bed the night of your first shower and do not  sleep with pets. Day of Surgery : Do not apply any lotions/deodorants the morning of surgery.  Please wear clean clothes to the hospital/surgery center.  ________________________________________________________________________ NO SOLID FOOD AFTER MIDNIGHT THE NIGHT PRIOR TO SURGERY. NOTHING BY MOUTH EXCEPT CLEAR LIQUIDS UNTIL 3 HOURS PRIOR TO SCHEULED SURGERY. PLEASE FINISH ENSURE DRINK PER SURGEON ORDER 3 HOURS PRIOR TO SCHEDULED SURGERY TIME WHICH NEEDS TO BE COMPLETED AT ___7:15 AM_________.   CLEAR LIQUID DIET   Foods Allowed                                                                     Foods Excluded  Coffee and tea, regular and decaf                             liquids that you cannot  Plain Jell-O in any flavor                                             see through such as: Fruit ices (not with fruit pulp)  milk, soups, orange juice  Iced Popsicles                                    All solid food Carbonated beverages, regular and diet                                    Cranberry, grape and apple juices Sports drinks like Gatorade Lightly seasoned clear broth or consume(fat free) Sugar, honey syrup  Sample Menu Breakfast                                Lunch                                     Supper Cranberry juice                    Beef broth                            Chicken broth Jell-O                                     Grape juice                           Apple juice Coffee or tea                        Jell-O                                      Popsicle                                                Coffee or tea                        Coffee or tea  _____________________________________________________________________

## 2017-06-17 ENCOUNTER — Ambulatory Visit (HOSPITAL_BASED_OUTPATIENT_CLINIC_OR_DEPARTMENT_OTHER): Payer: BLUE CROSS/BLUE SHIELD | Admitting: Certified Registered"

## 2017-06-17 ENCOUNTER — Other Ambulatory Visit: Payer: Self-pay

## 2017-06-17 ENCOUNTER — Ambulatory Visit (HOSPITAL_BASED_OUTPATIENT_CLINIC_OR_DEPARTMENT_OTHER)
Admission: RE | Admit: 2017-06-17 | Discharge: 2017-06-17 | Disposition: A | Discharge status: Home / Self Care | Payer: BLUE CROSS/BLUE SHIELD | Source: Ambulatory Visit | Attending: General Surgery | Admitting: General Surgery

## 2017-06-17 ENCOUNTER — Encounter (HOSPITAL_BASED_OUTPATIENT_CLINIC_OR_DEPARTMENT_OTHER)
Admission: RE | Disposition: A | Discharge status: Home / Self Care | Payer: Self-pay | Source: Ambulatory Visit | Attending: General Surgery

## 2017-06-17 ENCOUNTER — Encounter (HOSPITAL_BASED_OUTPATIENT_CLINIC_OR_DEPARTMENT_OTHER): Payer: Self-pay | Admitting: *Deleted

## 2017-06-17 DIAGNOSIS — L0591 Pilonidal cyst without abscess: Secondary | ICD-10-CM | POA: Insufficient documentation

## 2017-06-17 DIAGNOSIS — E039 Hypothyroidism, unspecified: Secondary | ICD-10-CM | POA: Insufficient documentation

## 2017-06-17 DIAGNOSIS — Z79890 Hormone replacement therapy (postmenopausal): Secondary | ICD-10-CM | POA: Diagnosis not present

## 2017-06-17 HISTORY — DX: Personal history of other diseases of the female genital tract: Z87.42

## 2017-06-17 HISTORY — DX: Personal history of other diseases of the circulatory system: Z86.79

## 2017-06-17 HISTORY — DX: Personal history of other specified conditions: Z87.898

## 2017-06-17 HISTORY — DX: Other specified hypothyroidism: E03.8

## 2017-06-17 HISTORY — DX: Pilonidal cyst with abscess: L05.01

## 2017-06-17 HISTORY — PX: PILONIDAL CYST EXCISION: SHX744

## 2017-06-17 HISTORY — DX: Personal history of other endocrine, nutritional and metabolic disease: Z86.39

## 2017-06-17 HISTORY — DX: Iron deficiency anemia, unspecified: D50.9

## 2017-06-17 HISTORY — DX: Presence of spectacles and contact lenses: Z97.3

## 2017-06-17 LAB — POCT PREGNANCY, URINE: Preg Test, Ur: NEGATIVE

## 2017-06-17 SURGERY — EXCISION, SIMPLE PILONIDAL CYST
Anesthesia: General | Site: Buttocks

## 2017-06-17 MED ORDER — FENTANYL CITRATE (PF) 100 MCG/2ML IJ SOLN
INTRAMUSCULAR | Status: AC
  Filled 2017-06-17: qty 2

## 2017-06-17 MED ORDER — DEXAMETHASONE SODIUM PHOSPHATE 10 MG/ML IJ SOLN
INTRAMUSCULAR | Status: AC
Start: 2017-06-17 — End: 2017-06-17
  Filled 2017-06-17: qty 1

## 2017-06-17 MED ORDER — DEXAMETHASONE SODIUM PHOSPHATE 10 MG/ML IJ SOLN
INTRAMUSCULAR | Status: DC | PRN
  Administered 2017-06-17: 10 mg via INTRAVENOUS

## 2017-06-17 MED ORDER — CEFAZOLIN SODIUM-DEXTROSE 2-4 GM/100ML-% IV SOLN
INTRAVENOUS | Status: AC
  Filled 2017-06-17: qty 100

## 2017-06-17 MED ORDER — FENTANYL CITRATE (PF) 100 MCG/2ML IJ SOLN
INTRAMUSCULAR | Status: DC | PRN
  Administered 2017-06-17: 50 ug via INTRAVENOUS
  Administered 2017-06-17: 25 ug via INTRAVENOUS

## 2017-06-17 MED ORDER — CEFAZOLIN SODIUM-DEXTROSE 2-4 GM/100ML-% IV SOLN
2.0000 g | INTRAVENOUS | Status: AC
  Administered 2017-06-17: 2 g via INTRAVENOUS
  Filled 2017-06-17: qty 100

## 2017-06-17 MED ORDER — BUPIVACAINE LIPOSOME 1.3 % IJ SUSP
INTRAMUSCULAR | Status: AC
  Filled 2017-06-17: qty 20

## 2017-06-17 MED ORDER — LIDOCAINE 2% (20 MG/ML) 5 ML SYRINGE
INTRAMUSCULAR | Status: AC
  Filled 2017-06-17: qty 5

## 2017-06-17 MED ORDER — MIDAZOLAM HCL 2 MG/2ML IJ SOLN
INTRAMUSCULAR | Status: AC
  Filled 2017-06-17: qty 2

## 2017-06-17 MED ORDER — SUGAMMADEX SODIUM 200 MG/2ML IV SOLN
INTRAVENOUS | Status: AC
Start: 2017-06-17 — End: 2017-06-17
  Filled 2017-06-17: qty 2

## 2017-06-17 MED ORDER — LIDOCAINE 2% (20 MG/ML) 5 ML SYRINGE
INTRAMUSCULAR | Status: DC | PRN
  Administered 2017-06-17: 50 mg via INTRAVENOUS

## 2017-06-17 MED ORDER — ROCURONIUM BROMIDE 10 MG/ML (PF) SYRINGE
PREFILLED_SYRINGE | INTRAVENOUS | Status: AC
  Filled 2017-06-17: qty 5

## 2017-06-17 MED ORDER — HYDROCODONE-ACETAMINOPHEN 5-325 MG PO TABS: 1.0000 | ORAL_TABLET | Freq: Four times a day (QID) | ORAL | 0 refills | Status: DC | PRN

## 2017-06-17 MED ORDER — ACETAMINOPHEN 500 MG PO TABS
1000.0000 mg | ORAL_TABLET | ORAL | Status: AC
  Administered 2017-06-17: 1000 mg via ORAL
  Filled 2017-06-17: qty 2

## 2017-06-17 MED ORDER — CHLORHEXIDINE GLUCONATE CLOTH 2 % EX PADS
6.0000 | MEDICATED_PAD | Freq: Once | CUTANEOUS | Status: DC
  Filled 2017-06-17: qty 6

## 2017-06-17 MED ORDER — ONDANSETRON HCL 4 MG/2ML IJ SOLN
INTRAMUSCULAR | Status: DC | PRN
  Administered 2017-06-17: 4 mg via INTRAVENOUS

## 2017-06-17 MED ORDER — PROPOFOL 10 MG/ML IV BOLUS
INTRAVENOUS | Status: AC
  Filled 2017-06-17: qty 20

## 2017-06-17 MED ORDER — SUGAMMADEX SODIUM 200 MG/2ML IV SOLN
INTRAVENOUS | Status: DC | PRN
  Administered 2017-06-17: 130 mg via INTRAVENOUS

## 2017-06-17 MED ORDER — PROPOFOL 10 MG/ML IV BOLUS
INTRAVENOUS | Status: DC | PRN
  Administered 2017-06-17: 110 mg via INTRAVENOUS

## 2017-06-17 MED ORDER — MIDAZOLAM HCL 2 MG/2ML IJ SOLN
INTRAMUSCULAR | Status: DC | PRN
  Administered 2017-06-17: 2 mg via INTRAVENOUS

## 2017-06-17 MED ORDER — BUPIVACAINE LIPOSOME 1.3 % IJ SUSP
INTRAMUSCULAR | Status: DC | PRN
  Administered 2017-06-17: 50 mL

## 2017-06-17 MED ORDER — 0.9 % SODIUM CHLORIDE (POUR BTL) OPTIME
TOPICAL | Status: DC | PRN
  Administered 2017-06-17: 500 mL

## 2017-06-17 MED ORDER — ONDANSETRON HCL 4 MG/2ML IJ SOLN
INTRAMUSCULAR | Status: AC
  Filled 2017-06-17: qty 2

## 2017-06-17 MED ORDER — LACTATED RINGERS IV SOLN
INTRAVENOUS | Status: DC
  Administered 2017-06-17 (×2): via INTRAVENOUS
  Filled 2017-06-17: qty 1000

## 2017-06-17 MED ORDER — GABAPENTIN 300 MG PO CAPS
ORAL_CAPSULE | ORAL | Status: AC
  Filled 2017-06-17: qty 1

## 2017-06-17 MED ORDER — ENSURE PRE-SURGERY PO LIQD
296.0000 mL | Freq: Once | ORAL | Status: AC
  Administered 2017-06-17: 296 mL via ORAL
  Filled 2017-06-17: qty 296

## 2017-06-17 MED ORDER — GABAPENTIN 300 MG PO CAPS
300.0000 mg | ORAL_CAPSULE | ORAL | Status: AC
  Administered 2017-06-17: 300 mg via ORAL
  Filled 2017-06-17: qty 1

## 2017-06-17 MED ORDER — ROCURONIUM BROMIDE 10 MG/ML (PF) SYRINGE
PREFILLED_SYRINGE | INTRAVENOUS | Status: DC | PRN
  Administered 2017-06-17: 40 mg via INTRAVENOUS

## 2017-06-17 MED ORDER — ACETAMINOPHEN 500 MG PO TABS
ORAL_TABLET | ORAL | Status: AC
  Filled 2017-06-17: qty 2

## 2017-06-17 MED ORDER — IBUPROFEN 800 MG PO TABS: 800.0000 mg | ORAL_TABLET | Freq: Three times a day (TID) | ORAL | 0 refills | Status: DC | PRN

## 2017-06-17 MED ORDER — BUPIVACAINE HCL (PF) 0.5 % IJ SOLN
INTRAMUSCULAR | Status: AC
  Filled 2017-06-17: qty 30

## 2017-06-17 SURGICAL SUPPLY — 55 items
BLADE EXTENDED COATED 6.5IN (ELECTRODE) IMPLANT
BLADE HEX COATED 2.75 (ELECTRODE) ×2 IMPLANT
BLADE SURG 10 STRL SS (BLADE) IMPLANT
BLADE SURG 15 STRL LF DISP TIS (BLADE) ×1 IMPLANT
BLADE SURG 15 STRL SS (BLADE) ×1
BRIEF STRETCH FOR OB PAD LRG (UNDERPADS AND DIAPERS) IMPLANT
CANISTER SUCT 3000ML PPV (MISCELLANEOUS) ×2 IMPLANT
CHLORAPREP W/TINT 26ML (MISCELLANEOUS) ×2 IMPLANT
COVER BACK TABLE 60X90IN (DRAPES) ×2 IMPLANT
COVER MAYO STAND STRL (DRAPES) ×2 IMPLANT
DECANTER SPIKE VIAL GLASS SM (MISCELLANEOUS) ×2 IMPLANT
DRAIN PENROSE 18X1/4 LTX STRL (WOUND CARE) IMPLANT
DRAPE LAPAROTOMY 100X72 PEDS (DRAPES) ×2 IMPLANT
DRAPE LG THREE QUARTER DISP (DRAPES) IMPLANT
DRAPE UTILITY XL STRL (DRAPES) ×2 IMPLANT
ELECT BLADE TIP CTD 4 INCH (ELECTRODE) IMPLANT
ELECT REM PT RETURN 9FT ADLT (ELECTROSURGICAL) ×2
ELECTRODE REM PT RTRN 9FT ADLT (ELECTROSURGICAL) ×1 IMPLANT
GAUZE SPONGE 4X4 12PLY STRL (GAUZE/BANDAGES/DRESSINGS) ×2 IMPLANT
GAUZE SPONGE 4X4 16PLY XRAY LF (GAUZE/BANDAGES/DRESSINGS) IMPLANT
GAUZE VASELINE 3X9 (GAUZE/BANDAGES/DRESSINGS) IMPLANT
GLOVE BIOGEL PI IND STRL 7.0 (GLOVE) ×1 IMPLANT
GLOVE BIOGEL PI INDICATOR 7.0 (GLOVE) ×1
GLOVE SURG SS PI 7.0 STRL IVOR (GLOVE) ×2 IMPLANT
GOWN STRL REUS W/ TWL LRG LVL3 (GOWN DISPOSABLE) ×1 IMPLANT
GOWN STRL REUS W/TWL LRG LVL3 (GOWN DISPOSABLE) ×1
KIT TURNOVER CYSTO (KITS) ×2 IMPLANT
NDL SAFETY ECLIPSE 18X1.5 (NEEDLE) IMPLANT
NEEDLE HYPO 18GX1.5 SHARP (NEEDLE)
NEEDLE HYPO 25X1 1.5 SAFETY (NEEDLE) ×2 IMPLANT
NS IRRIG 500ML POUR BTL (IV SOLUTION) ×2 IMPLANT
PACK BASIN DAY SURGERY FS (CUSTOM PROCEDURE TRAY) ×2 IMPLANT
PAD ABD 8X10 STRL (GAUZE/BANDAGES/DRESSINGS) ×2 IMPLANT
PAD ARMBOARD 7.5X6 YLW CONV (MISCELLANEOUS) IMPLANT
PENCIL BUTTON HOLSTER BLD 10FT (ELECTRODE) ×2 IMPLANT
SPONGE LAP 18X18 X RAY DECT (DISPOSABLE) IMPLANT
SPONGE LAP 4X18 X RAY DECT (DISPOSABLE) IMPLANT
SPONGE SURGIFOAM ABS GEL 12-7 (HEMOSTASIS) IMPLANT
SUT CHROMIC 3 0 SH 27 (SUTURE) IMPLANT
SUT ETHILON 3 0 PS 1 (SUTURE) IMPLANT
SUT ETHILON 4 0 PS 2 18 (SUTURE) ×2 IMPLANT
SUT MNCRL AB 4-0 PS2 18 (SUTURE) IMPLANT
SUT VIC AB 2-0 SH 18 (SUTURE) ×2 IMPLANT
SUT VIC AB 2-0 SH 27 (SUTURE)
SUT VIC AB 2-0 SH 27XBRD (SUTURE) IMPLANT
SUT VIC AB 3-0 SH 27 (SUTURE) ×1
SUT VIC AB 3-0 SH 27X BRD (SUTURE) ×1 IMPLANT
SUT VIC AB 4-0 P-3 18XBRD (SUTURE) IMPLANT
SUT VIC AB 4-0 P3 18 (SUTURE)
SYR BULB IRRIGATION 50ML (SYRINGE) ×2 IMPLANT
SYR CONTROL 10ML LL (SYRINGE) ×2 IMPLANT
TOWEL OR 17X24 6PK STRL BLUE (TOWEL DISPOSABLE) ×4 IMPLANT
TRAY DSU PREP LF (CUSTOM PROCEDURE TRAY) IMPLANT
TUBE CONNECTING 12X1/4 (SUCTIONS) ×2 IMPLANT
YANKAUER SUCT BULB TIP NO VENT (SUCTIONS) ×2 IMPLANT

## 2017-06-17 NOTE — Op Note (Signed)
Preoperative diagnosis: pilonidal cyst  Postoperative diagnosis: same   Procedure: excision of pilonidal cyst  Surgeon: Feliciana RossettiLuke Kinsinger, M.D.  Asst: none  Anesthesia: general  Indications for procedure: Laura MiuLisa Baxter is a 27 y.o. year old female with symptoms of drainage and recurrent pain from pilonidal cyst.  Description of procedure: The patient was brought into the operative suite. Anesthesia was administered with General endotracheal anesthesia. WHO checklist was applied. The patient was then placed in prone position. The area was prepped and draped in the usual sterile fashion.  Next, Exparel:Marcaine Mix was infused into the surrounding tissue. An elliptical incision was made and cautery was used to dissect the cyst away from the surrounding tissue. The cyst was excised in its entirety. The wound bed was irrigated and hemostasis was applied with cautery. Interrupted 2-0 vicryl was used to appose the deep space. 3-0 vicryl was used to close the superficial layers in interrupted fashion. A 4-0 nylon was used to close the skin by vertical mattress. Dressing was put in place. The patient tolerated the procedure well and was brought to pacu in stable condition  Findings: small pilonidal cyst  Specimen: pilonidal cyst  Implant: none   Blood loss: <7330ml  Local anesthesia: 50 ml Exparel:Marcaine Mix  Complications: none  Feliciana RossettiLuke Kinsinger, M.D. General, Bariatric, & Minimally Invasive Surgery [facility]

## 2017-06-17 NOTE — Transfer of Care (Signed)
Immediate Anesthesia Transfer of Care Note  Patient: Laura Baxter  Procedure(s) Performed: Procedure(s) (LRB): CYST EXCISION PILONIDAL (N/A)  Patient Location: PACU  Anesthesia Type: General  Level of Consciousness: awake, oriented, sedated and patient cooperative  Airway & Oxygen Therapy: Patient Spontanous Breathing and Patient connected to face mask oxygen  Post-op Assessment: Report given to PACU RN and Post -op Vital signs reviewed and stable  Post vital signs: Reviewed and stable  Complications: No apparent anesthesia complications Last Vitals:  Vitals Value Taken Time  BP 99/66 06/17/2017 11:15 AM  Temp 36.4 C 06/17/2017 11:14 AM  Pulse 64 06/17/2017 11:17 AM  Resp 13 06/17/2017 11:17 AM  SpO2 100 % 06/17/2017 11:17 AM  Vitals shown include unvalidated device data.  Last Pain:  Vitals:   06/17/17 0810  TempSrc: Oral      Patients Stated Pain Goal: 5 (06/17/17 [id number])

## 2017-06-17 NOTE — H&P (Signed)
Laura Baxter is an 27 y.o. female.   Chief Complaint: posterior cyst HPI: 27 yo female with cyst that has been draining intermittently and painful.  Past Medical History:  Diagnosis Date  . History of febrile seizure    INFANT  . History of hyperthyroidism    DX 2015--  TSH LEVELS NORMALIZED WITHOUT ANY TREATMENT  . History of hypotension   . History of ovarian cyst   . Hypothyroidism, secondary ENDOCRINOLOGIST-  DR Laura Baxter   SECONDARY TO LYMPHOCYTIC THYROIDITIS  . Iron deficiency anemia   . Pilonidal cyst with abscess   . Wears glasses     Past Surgical History:  Procedure Laterality Date  . SHOULDER SURGERY Left 2012   capsular repair    Family History  Problem Relation Age of Onset  . Depression Mother   . Anemia Mother   . Heart attack Father   . Hypertension Father   . Hyperlipidemia Father   . Heart disease Father   . Depression Father   . Glaucoma Father   . Graves' disease Father   . Thyroid disease Father   . Cancer Maternal Grandmother        pancreatic  . Breast cancer Paternal Grandmother   . Leukemia Paternal Grandmother   . Leukemia Paternal Grandfather    Social History:  reports that she has never smoked. She has never used smokeless tobacco. She reports that she does not drink alcohol or use drugs.  Allergies:  Allergies  Allergen Reactions  . Iodine Rash  . Shellfish Allergy Rash    Medications Prior to Admission  Medication Sig Dispense Refill  . ibuprofen (ADVIL,MOTRIN) 200 MG tablet Take 200 mg by mouth every 6 (six) hours as needed.    Marland Kitchen levothyroxine (SYNTHROID, LEVOTHROID) 100 MCG tablet Take 1 tablet (100 mcg total) by mouth daily before breakfast. (Patient taking differently: Take 100 mcg by mouth daily before breakfast. ) 30 tablet 5  . PARAGARD INTRAUTERINE COPPER IUD IUD 1 each by Intrauterine route once. Placed 02/ 2019      Results for orders placed or performed during the hospital encounter of 06/17/17 (from the past 48  hour(s))  Pregnancy, urine POC     Status: None   Collection Time: 06/17/17  8:36 AM  Result Value Ref Range   Preg Test, Ur NEGATIVE NEGATIVE    Comment:        THE SENSITIVITY OF THIS METHODOLOGY IS >24 Baxter/mL    No results found.  Review of Systems  Constitutional: Negative for chills and fever.  HENT: Negative for hearing loss.   Eyes: Negative for blurred vision and double vision.  Respiratory: Negative for cough and hemoptysis.   Cardiovascular: Negative for chest pain and palpitations.  Gastrointestinal: Negative for abdominal pain, nausea and vomiting.  Genitourinary: Negative for dysuria and urgency.  Musculoskeletal: Negative for myalgias and neck pain.  Skin: Negative for itching and rash.  Neurological: Negative for dizziness, tingling and headaches.  Endo/Heme/Allergies: Does not bruise/bleed easily.  Psychiatric/Behavioral: Negative for depression and suicidal ideas.    Blood pressure (!) 108/58, pulse 90, temperature 98.4 F (36.9 C), temperature source Oral, resp. rate 16, height 5\' 4"  (1.626 m), weight 68.7 kg (151 lb 8 oz), last menstrual period 05/04/2017, SpO2 100 %, not currently breastfeeding. Physical Exam  Vitals reviewed. Constitutional: She is oriented to person, place, and time. She appears well-developed and well-nourished.  HENT:  Head: Normocephalic and atraumatic.  Eyes: Pupils are equal, round, and reactive to light.  Conjunctivae and EOM are normal.  Neck: Normal range of motion. Neck supple.  Cardiovascular: Normal rate and regular rhythm.  Respiratory: Effort normal and breath sounds normal.  GI: Soft. Bowel sounds are normal. She exhibits no distension. There is no tenderness.  Musculoskeletal: Normal range of motion.  Neurological: She is alert and oriented to person, place, and time.  Skin: Skin is warm and dry.  Psychiatric: She has a normal mood and affect. Her behavior is normal.     Assessment/Plan 27 yo female with pilonidal cyst  which drains on occasion. -pilonidal cyst excision -enhanced recovery -planned outpatient  Rodman PickleLuke Aaron Kinsinger, MD 06/17/2017, 9:46 AM

## 2017-06-17 NOTE — Anesthesia Postprocedure Evaluation (Signed)
Anesthesia Post Note  Patient: Otilio MiuLisa Grigley  Procedure(s) Performed: CYST EXCISION PILONIDAL (N/A Buttocks)     Patient location during evaluation: PACU Anesthesia Type: General Level of consciousness: awake and alert Pain management: pain level controlled Vital Signs Assessment: post-procedure vital signs reviewed and stable Respiratory status: spontaneous breathing, nonlabored ventilation, respiratory function stable and patient connected to nasal cannula oxygen Cardiovascular status: blood pressure returned to baseline and stable Postop Assessment: no apparent nausea or vomiting Anesthetic complications: no    Last Vitals:  Vitals:   06/17/17 1130 06/17/17 1145  BP: 99/64 104/72  Pulse: (!) 58 61  Resp: (!) 0 10  Temp:    SpO2: 100% 100%    Last Pain:  Vitals:   06/17/17 1145  TempSrc:   PainSc: 0-No pain                 Reily Ilic S

## 2017-06-17 NOTE — Anesthesia Procedure Notes (Signed)
Procedure Name: Intubation Date/Time: 06/17/2017 10:18 AM Performed by: Suan Halter, CRNA Pre-anesthesia Checklist: Patient identified, Emergency Drugs available, Suction available and Patient being monitored Patient Re-evaluated:Patient Re-evaluated prior to induction Oxygen Delivery Method: Circle system utilized Preoxygenation: Pre-oxygenation with 100% oxygen Induction Type: IV induction Ventilation: Mask ventilation without difficulty Laryngoscope Size: Mac and 3 Grade View: Grade I Tube type: Oral Tube size: 7.0 mm Number of attempts: 1 Airway Equipment and Method: Stylet and Oral airway Placement Confirmation: ETT inserted through vocal cords under direct vision,  positive ETCO2 and breath sounds checked- equal and bilateral Secured at: 22 cm Tube secured with: Tape Dental Injury: Teeth and Oropharynx as per pre-operative assessment

## 2017-06-17 NOTE — Anesthesia Preprocedure Evaluation (Signed)
Anesthesia Evaluation  Patient identified by MRN, date of birth, ID band Patient awake    Reviewed: Allergy & Precautions, NPO status , Patient's Chart, lab work & pertinent test results  Airway Mallampati: II  TM Distance: >3 FB Neck ROM: Full    Dental no notable dental hx.    Pulmonary neg pulmonary ROS,    Pulmonary exam normal breath sounds clear to auscultation       Cardiovascular negative cardio ROS Normal cardiovascular exam Rhythm:Regular Rate:Normal     Neuro/Psych negative neurological ROS  negative psych ROS   GI/Hepatic negative GI ROS, Neg liver ROS,   Endo/Other  Hypothyroidism   Renal/GU negative Renal ROS  negative genitourinary   Musculoskeletal negative musculoskeletal ROS (+)   Abdominal   Peds negative pediatric ROS (+)  Hematology negative hematology ROS (+)   Anesthesia Other Findings   Reproductive/Obstetrics negative OB ROS                             Anesthesia Physical Anesthesia Plan  ASA: II  Anesthesia Plan: General   Post-op Pain Management:    Induction: Intravenous  PONV Risk Score and Plan: 3 and Ondansetron, Dexamethasone, Midazolam and Treatment may vary due to age or medical condition  Airway Management Planned: Oral ETT  Additional Equipment:   Intra-op Plan:   Post-operative Plan: Extubation in OR  Informed Consent: I have reviewed the patients History and Physical, chart, labs and discussed the procedure including the risks, benefits and alternatives for the proposed anesthesia with the patient or authorized representative who has indicated his/her understanding and acceptance.   Dental advisory given  Plan Discussed with: CRNA and Surgeon  Anesthesia Plan Comments:         Anesthesia Quick Evaluation  

## 2017-06-17 NOTE — Discharge Instructions (Signed)
°  Post Anesthesia Home Care Instructions ° °Activity: °Get plenty of rest for the remainder of the day. A responsible individual must stay with you for 24 hours following the procedure.  °For the next 24 hours, DO NOT: °-Drive a car °-Operate machinery °-Drink alcoholic beverages °-Take any medication unless instructed by your physician °-Make any legal decisions or sign important papers. ° °Meals: °Start with liquid foods such as gelatin or soup. Progress to regular foods as tolerated. Avoid greasy, spicy, heavy foods. If nausea and/or vomiting occur, drink only clear liquids until the nausea and/or vomiting subsides. Call your physician if vomiting continues. ° °Special Instructions/Symptoms: °Your throat may feel dry or sore from the anesthesia or the breathing tube placed in your throat during surgery. If this causes discomfort, gargle with warm salt water. The discomfort should disappear within 24 hours. ° °If you had a scopolamine patch placed behind your ear for the management of post- operative nausea and/or vomiting: ° °1. The medication in the patch is effective for 72 hours, after which it should be removed.  Wrap patch in a tissue and discard in the trash. Wash hands thoroughly with soap and water. °2. You may remove the patch earlier than 72 hours if you experience unpleasant side effects which may include dry mouth, dizziness or visual disturbances. °3. Avoid touching the patch. Wash your hands with soap and water after contact with the patch. °  ° ° °Information for Discharge Teaching: °EXPAREL (bupivacaine liposome injectable suspension)  ° °Your surgeon gave you EXPAREL(bupivacaine) in your surgical incision to help control your pain after surgery.  °· EXPAREL is a local anesthetic that provides pain relief by numbing the tissue around the surgical site. °· EXPAREL is designed to release pain medication over time and can control pain for up to 72 hours. °· Depending on how you respond to EXPAREL,  you may require less pain medication during your recovery. ° °Possible side effects: °· Temporary loss of sensation or ability to move in the area where bupivacaine was injected. °· Nausea, vomiting, constipation °· Rarely, numbness and tingling in your mouth or lips, lightheadedness, or anxiety may occur. °· Call your doctor right away if you think you may be experiencing any of these sensations, or if you have other questions regarding possible side effects. ° °Follow all other discharge instructions given to you by your surgeon or nurse. Eat a healthy diet and drink plenty of water or other fluids. ° °If you return to the hospital for any reason within 96 hours following the administration of EXPAREL, please inform your health care providers. °

## 2017-06-18 ENCOUNTER — Encounter (HOSPITAL_BASED_OUTPATIENT_CLINIC_OR_DEPARTMENT_OTHER): Payer: Self-pay | Admitting: General Surgery

## 2017-07-08 DIAGNOSIS — E069 Thyroiditis, unspecified: Secondary | ICD-10-CM | POA: Diagnosis not present

## 2017-07-08 DIAGNOSIS — E0500 Thyrotoxicosis with diffuse goiter without thyrotoxic crisis or storm: Secondary | ICD-10-CM | POA: Diagnosis not present

## 2017-07-08 DIAGNOSIS — D351 Benign neoplasm of parathyroid gland: Secondary | ICD-10-CM | POA: Diagnosis not present

## 2017-07-08 DIAGNOSIS — E0590 Thyrotoxicosis, unspecified without thyrotoxic crisis or storm: Secondary | ICD-10-CM | POA: Diagnosis not present

## 2017-07-09 DIAGNOSIS — N6320 Unspecified lump in the left breast, unspecified quadrant: Secondary | ICD-10-CM | POA: Diagnosis not present

## 2017-07-09 DIAGNOSIS — R599 Enlarged lymph nodes, unspecified: Secondary | ICD-10-CM | POA: Diagnosis not present

## 2017-07-17 DIAGNOSIS — N6489 Other specified disorders of breast: Secondary | ICD-10-CM | POA: Diagnosis not present

## 2017-07-17 DIAGNOSIS — N63 Unspecified lump in unspecified breast: Secondary | ICD-10-CM | POA: Diagnosis not present

## 2017-07-21 ENCOUNTER — Other Ambulatory Visit: Payer: Self-pay | Admitting: General Surgery

## 2017-07-21 DIAGNOSIS — E0590 Thyrotoxicosis, unspecified without thyrotoxic crisis or storm: Secondary | ICD-10-CM

## 2017-07-22 ENCOUNTER — Ambulatory Visit
Admission: RE | Admit: 2017-07-22 | Discharge: 2017-07-22 | Disposition: A | Discharge status: Home / Self Care | Payer: BLUE CROSS/BLUE SHIELD | Source: Ambulatory Visit | Attending: General Surgery | Admitting: General Surgery

## 2017-07-22 DIAGNOSIS — E0590 Thyrotoxicosis, unspecified without thyrotoxic crisis or storm: Secondary | ICD-10-CM

## 2017-07-22 DIAGNOSIS — E041 Nontoxic single thyroid nodule: Secondary | ICD-10-CM | POA: Diagnosis not present

## 2017-08-03 ENCOUNTER — Other Ambulatory Visit: Payer: Self-pay | Admitting: Endocrinology

## 2017-08-04 DIAGNOSIS — R102 Pelvic and perineal pain: Secondary | ICD-10-CM | POA: Diagnosis not present

## 2017-08-04 DIAGNOSIS — N920 Excessive and frequent menstruation with regular cycle: Secondary | ICD-10-CM | POA: Diagnosis not present

## 2017-08-04 DIAGNOSIS — N83209 Unspecified ovarian cyst, unspecified side: Secondary | ICD-10-CM | POA: Diagnosis not present

## 2017-08-05 DIAGNOSIS — D351 Benign neoplasm of parathyroid gland: Secondary | ICD-10-CM | POA: Diagnosis not present

## 2017-08-08 DIAGNOSIS — E210 Primary hyperparathyroidism: Secondary | ICD-10-CM | POA: Diagnosis not present

## 2017-10-13 DIAGNOSIS — Z309 Encounter for contraceptive management, unspecified: Secondary | ICD-10-CM | POA: Diagnosis not present

## 2017-10-13 DIAGNOSIS — Z6826 Body mass index (BMI) 26.0-26.9, adult: Secondary | ICD-10-CM | POA: Diagnosis not present

## 2017-10-13 DIAGNOSIS — Z01419 Encounter for gynecological examination (general) (routine) without abnormal findings: Secondary | ICD-10-CM | POA: Diagnosis not present

## 2017-10-13 DIAGNOSIS — Z124 Encounter for screening for malignant neoplasm of cervix: Secondary | ICD-10-CM | POA: Diagnosis not present

## 2017-10-13 DIAGNOSIS — R5383 Other fatigue: Secondary | ICD-10-CM | POA: Diagnosis not present

## 2017-11-02 DIAGNOSIS — L720 Epidermal cyst: Secondary | ICD-10-CM | POA: Diagnosis not present

## 2017-11-02 DIAGNOSIS — L989 Disorder of the skin and subcutaneous tissue, unspecified: Secondary | ICD-10-CM | POA: Diagnosis not present

## 2017-11-10 ENCOUNTER — Other Ambulatory Visit (INDEPENDENT_AMBULATORY_CARE_PROVIDER_SITE_OTHER): Payer: BLUE CROSS/BLUE SHIELD

## 2017-11-10 ENCOUNTER — Telehealth: Payer: Self-pay | Admitting: Endocrinology

## 2017-11-10 DIAGNOSIS — E039 Hypothyroidism, unspecified: Secondary | ICD-10-CM

## 2017-11-10 NOTE — Telephone Encounter (Signed)
Patient is wanting to come in today to have her labs done so the results will be back for her appt.   Please advise

## 2017-11-10 NOTE — Telephone Encounter (Signed)
Ok, I ordered 

## 2017-11-10 NOTE — Telephone Encounter (Signed)
Please advise 

## 2017-11-10 NOTE — Telephone Encounter (Signed)
Pt aware/scheduled for lab visit for today/thx dmf

## 2017-11-11 ENCOUNTER — Encounter: Payer: Self-pay | Admitting: Endocrinology

## 2017-11-11 ENCOUNTER — Ambulatory Visit (INDEPENDENT_AMBULATORY_CARE_PROVIDER_SITE_OTHER): Payer: BLUE CROSS/BLUE SHIELD | Admitting: Endocrinology

## 2017-11-11 VITALS — BP 102/56 | HR 79 | Ht 64.0 in | Wt 154.2 lb

## 2017-11-11 DIAGNOSIS — E039 Hypothyroidism, unspecified: Secondary | ICD-10-CM

## 2017-11-11 LAB — TSH: TSH: 1.27 u[IU]/mL (ref 0.35–4.50)

## 2017-11-11 LAB — T4, FREE: Free T4: 1.26 ng/dL (ref 0.60–1.60)

## 2017-11-11 NOTE — Patient Instructions (Addendum)
Please continue the same medication.   Please come back for a follow-up appointment in 6 months, or sooner if a pregnancy happens.   Also, please call or message us if you think the thyroid might be off, so we can recheck then.

## 2017-11-11 NOTE — Progress Notes (Signed)
Subjective:    Patient ID: Laura Baxter, female    DOB: 09/18/1990, 27 y.o.   MRN: [id number]  HPI Pt returns for f/u of hyperthyroidism (dx'ed 2015; she cycled twice between hyper- and hypothyroidism until 2018, when she developed her current hypothyroidism; she takes synthroid; US showed thyroiditis).  Main symptom is fatigue.   Past Medical History:  Diagnosis Date  . History of febrile seizure    INFANT  . History of hyperthyroidism    DX 2015--  TSH LEVELS NORMALIZED WITHOUT ANY TREATMENT  . History of hypotension   . History of ovarian cyst   . 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 Iron deficiency anemia   . Pilonidal cyst with abscess   . Wears glasses     Past Surgical History:  Procedure Laterality Date  . PILONIDAL CYST EXCISION N/A 06/17/2017   Procedure: CYST EXCISION PILONIDAL;  Surgeon: Sheliah Hatch, De Blanch, MD;  Location: [facility] [place];  Service: General;  Laterality: N/A;  . SHOULDER SURGERY Left 2012   capsular repair    Social History   Socioeconomic History  . Marital status: Married    Spouse name: Not on file  . Number of children: Not on file  . Years of education: Not on file  . Highest education level: Not on file  Occupational History  . Not on file  Social Needs  . Financial resource strain: Not on file  . Food insecurity:    Worry: Not on file    Inability: Not on file  . Transportation needs:    Medical: Not on file    Non-medical: Not on file  Tobacco Use  . Smoking status: Never Smoker  . Smokeless tobacco: Never Used  Substance and Sexual Activity  . Alcohol use: No  . Drug use: No  . Sexual activity: Not on file    Comment: IUD placed 02/ 2019  Lifestyle  . Physical activity:    Days per week: Not on file    Minutes per session: Not on file  . Stress: Not on file  Relationships  . Social connections:    Talks on phone: Not on file    Gets together:  Not on file    Attends religious service: Not on file    Active member of club or organization: Not on file    Attends meetings of clubs or organizations: Not on file    Relationship status: Not on file  . Intimate partner violence:    Fear of current or ex partner: Not on file    Emotionally abused: Not on file    Physically abused: Not on file    Forced sexual activity: Not on file  Other Topics Concern  . Not on file  Social History Narrative  . Not on file    Current Outpatient Medications on File Prior to Visit  Medication Sig Dispense Refill  . levothyroxine (SYNTHROID, LEVOTHROID) 100 MCG tablet TAKE 1 TABLET (100 MCG TOTAL) BY MOUTH DAILY BEFORE BREAKFAST. 30 tablet 3  . PARAGARD INTRAUTERINE COPPER IUD IUD 1 each by Intrauterine route once. Placed 02/ 2019     No current facility-administered medications on file prior to visit.     Allergies  Allergen Reactions  . Iodine Rash  . Shellfish Allergy Rash  . Latex     Family History  Problem Relation Age of Onset  . Depression Mother   . Anemia Mother   .  Heart attack Father   . Hypertension Father   . Hyperlipidemia Father   . Heart disease Father   . Depression Father   . Glaucoma Father   . Graves' disease Father   . Thyroid disease Father   . Cancer Maternal Grandmother        pancreatic  . Breast cancer Paternal Grandmother   . Leukemia Paternal Grandmother   . Leukemia Paternal Grandfather     BP (!) 102/56   Pulse 79   Ht 5\' 4"  (1.626 m)   Wt 154 lb 3.2 oz (69.9 kg)   LMP 10/12/2017   SpO2 97%   BMI 26.47 kg/m    Review of Systems Denies leg swelling.      Objective:   Physical Exam VITAL SIGNS:  See vs page GENERAL: no distress NECK: thyroid is slightly and diffusely enlarged.  No thyroid nodule is palpable.  No palpable lymphadenopathy at the anterior neck.        Assessment & Plan:  Hyperthyroidism: recheck today. Goiter: clinically stable.  We'll follow  Patient Instructions    Please continue the same medication.   Please come back for a follow-up appointment in 6 months, or sooner if a pregnancy happens.   Also, please call or message us if you think the thyroid might be off, so we can recheck then.

## 2017-11-19 ENCOUNTER — Other Ambulatory Visit: Payer: Self-pay

## 2017-11-19 DIAGNOSIS — R0683 Snoring: Secondary | ICD-10-CM | POA: Diagnosis not present

## 2017-11-19 DIAGNOSIS — D649 Anemia, unspecified: Secondary | ICD-10-CM | POA: Diagnosis not present

## 2017-11-19 DIAGNOSIS — R5382 Chronic fatigue, unspecified: Secondary | ICD-10-CM | POA: Diagnosis not present

## 2017-11-19 DIAGNOSIS — R5383 Other fatigue: Secondary | ICD-10-CM | POA: Diagnosis not present

## 2017-11-19 MED ORDER — LEVOTHYROXINE SODIUM 100 MCG PO TABS
ORAL_TABLET | ORAL | 3 refills | Status: DC
Start: 2017-11-19 — End: 2018-12-21

## 2017-12-07 DIAGNOSIS — R0683 Snoring: Secondary | ICD-10-CM | POA: Diagnosis not present

## 2017-12-08 DIAGNOSIS — R0683 Snoring: Secondary | ICD-10-CM | POA: Diagnosis not present

## 2017-12-11 ENCOUNTER — Other Ambulatory Visit: Payer: Self-pay | Admitting: Endocrinology

## 2017-12-17 DIAGNOSIS — R55 Syncope and collapse: Secondary | ICD-10-CM | POA: Diagnosis not present

## 2017-12-17 DIAGNOSIS — R42 Dizziness and giddiness: Secondary | ICD-10-CM | POA: Diagnosis not present

## 2017-12-17 DIAGNOSIS — R110 Nausea: Secondary | ICD-10-CM | POA: Diagnosis not present

## 2018-01-05 DIAGNOSIS — I959 Hypotension, unspecified: Secondary | ICD-10-CM | POA: Diagnosis not present

## 2018-01-05 DIAGNOSIS — R42 Dizziness and giddiness: Secondary | ICD-10-CM | POA: Diagnosis not present

## 2018-01-05 DIAGNOSIS — R55 Syncope and collapse: Secondary | ICD-10-CM | POA: Diagnosis not present

## 2018-01-11 ENCOUNTER — Encounter: Payer: Self-pay | Admitting: Endocrinology

## 2018-01-11 ENCOUNTER — Ambulatory Visit (INDEPENDENT_AMBULATORY_CARE_PROVIDER_SITE_OTHER): Payer: BLUE CROSS/BLUE SHIELD | Admitting: Endocrinology

## 2018-01-11 VITALS — BP 132/70 | HR 91 | Ht 64.0 in | Wt 155.8 lb

## 2018-01-11 DIAGNOSIS — R42 Dizziness and giddiness: Secondary | ICD-10-CM | POA: Insufficient documentation

## 2018-01-11 DIAGNOSIS — E039 Hypothyroidism, unspecified: Secondary | ICD-10-CM

## 2018-01-11 LAB — TSH: TSH: 2.23 u[IU]/mL (ref 0.35–4.50)

## 2018-01-11 LAB — BASIC METABOLIC PANEL
BUN: 8 mg/dL (ref 6–23)
CO2: 25 mEq/L (ref 19–32)
Calcium: 9.6 mg/dL (ref 8.4–10.5)
Chloride: 105 mEq/L (ref 96–112)
Creatinine, Ser: 0.82 mg/dL (ref 0.40–1.20)
GFR: 87.7 mL/min (ref >60.00)
Glucose, Bld: 93 mg/dL (ref 70–99)
Potassium: 4 mEq/L (ref 3.5–5.1)
Sodium: 138 mEq/L (ref 135–145)

## 2018-01-11 LAB — CORTISOL
Cortisol, Plasma: 6.1 ug/dL
Cortisol, Plasma: 18.8 ug/dL

## 2018-01-11 LAB — T4, FREE: Free T4: 1.06 ng/dL (ref 0.60–1.60)

## 2018-01-11 MED ORDER — COSYNTROPIN NICU IV SYRINGE 0.25 MG/ML (STANDARD DOSE)
0.2500 mg | Freq: Once | INTRAVENOUS | Status: AC
  Administered 2018-01-11: 0.25 mg via INTRAMUSCULAR

## 2018-01-11 NOTE — Progress Notes (Signed)
**Note Laura-Identified via Obfuscation** Subjective:    Patient ID: Laura Baxter, female    DOB: [birth year], 27 y.o.   MRN: [id number]  HPI Pt returns for f/u of hyperthyroidism (dx'ed 2015; she cycled twice between hyper- and hypothyroidism until 2018, when she developed her current hypothyroidism; she takes synthroid; US showed thyroiditis; she has an IUD now).  She has a few years of slight dizziness sensation in the head, and assoc fatigue.  Past Medical History:  Diagnosis Date  . History of febrile seizure    INFANT  . History of hyperthyroidism    DX 2015--  TSH LEVELS NORMALIZED WITHOUT ANY TREATMENT  . History of hypotension   . History of ovarian cyst   . Hypothyroidism, secondary ENDOCRINOLOGIST-  Laura Baxter   SECONDARY TO LYMPHOCYTIC THYROIDITIS  . Iron deficiency anemia   . Pilonidal cyst with abscess   . Wears glasses     Past Surgical History:  Procedure Laterality Date  . PILONIDAL CYST EXCISION N/A 06/17/2017   Procedure: CYST EXCISION PILONIDAL;  Surgeon: Sheliah HatchKinsinger, Laura BlanchLuke Aaron, MD;  [facility];  Service: General;  Laterality: N/A;  . SHOULDER SURGERY Left 2012   capsular repair    Social History   Socioeconomic History  . Marital status: Married    Spouse name: Not on file  . Number of children: Not on file  . Years of education: Not on file  . Highest education level: Not on file  Occupational History  . Not on file  Social Needs  . Financial resource strain: Not on file  . Food insecurity:    Worry: Not on file    Inability: Not on file  . Transportation needs:    Medical: Not on file    Non-medical: Not on file  Tobacco Use  . Smoking status: Never Smoker  . Smokeless tobacco: Never Used  Substance and Sexual Activity  . Alcohol use: No  . Drug use: No  . Sexual activity: Not on file    Comment: IUD placed 02/ 2019  Lifestyle  . Physical activity:    Days per week: Not on file    Minutes per session: Not on file  . Stress: Not on file  Relationships    . Social connections:    Talks on phone: Not on file    Gets together: Not on file    Attends religious service: Not on file    Active member of club or organization: Not on file    Attends meetings of clubs or organizations: Not on file    Relationship status: Not on file  . Intimate partner violence:    Fear of current or ex partner: Not on file    Emotionally abused: Not on file    Physically abused: Not on file    Forced sexual activity: Not on file  Other Topics Concern  . Not on file  Social History Narrative  . Not on file    Current Outpatient Medications on File Prior to Visit  Medication Sig Dispense Refill  . levothyroxine (SYNTHROID, LEVOTHROID) 100 MCG tablet Take one tablet daily in the morning 90 tablet 3  . PARAGARD INTRAUTERINE COPPER IUD IUD 1 each by Intrauterine route once. Placed 02/ 2019     No current facility-administered medications on file prior to visit.     Allergies  Allergen Reactions  . Iodine Rash  . Shellfish Allergy Rash  . Latex     Family History  Problem Relation Age of Onset  .  Depression Mother   . Anemia Mother   . Heart attack Father   . Hypertension Father   . Hyperlipidemia Father   . Heart disease Father   . Depression Father   . Glaucoma Father   . Graves' disease Father   . Thyroid disease Father   . Cancer Maternal Grandmother        pancreatic  . Breast cancer Paternal Grandmother   . Leukemia Paternal Grandmother   . Leukemia Paternal Grandfather     BP 132/70 (BP Location: Left Arm, Patient Position: Sitting, Cuff Size: Normal)   Pulse 91   Ht 5\' 4"  (1.626 m)   Wt 155 lb 12.8 oz (70.7 kg)   SpO2 92%   BMI 26.74 kg/m    Review of Systems No LOC.  No change in skin tone.      Objective:   Physical Exam VITAL SIGNS:  See vs page GENERAL: no distress LUNGS:  Clear to auscultation.  HEART:  Regular rate and rhythm without murmurs noted. Normal S1,S2.    ACTH stimulation test is done: baseline  cortisol level=6 then Cosyntropin 250 mcg is given im 45 minutes later, cortisol level=19 (normal response)     Assessment & Plan:  Dizziness: I told pt that adrenal insuff is excluded Hypothyroidism: recheck today  Patient Instructions  blood tests are requested for you today.  We'll let you know about the results.   Also, please call or message [place] if you think the thyroid might be off, so we can recheck the blood tests

## 2018-01-11 NOTE — Patient Instructions (Addendum)
blood tests are requested for you today.  We'll let you know about the results.   Also, please call or message us if you think the thyroid might be off, so we can recheck the blood tests

## 2018-01-13 DIAGNOSIS — E039 Hypothyroidism, unspecified: Secondary | ICD-10-CM | POA: Diagnosis not present

## 2018-01-13 DIAGNOSIS — R55 Syncope and collapse: Secondary | ICD-10-CM | POA: Diagnosis not present

## 2018-01-14 DIAGNOSIS — R55 Syncope and collapse: Secondary | ICD-10-CM | POA: Diagnosis not present

## 2018-03-08 DIAGNOSIS — R140 Abdominal distension (gaseous): Secondary | ICD-10-CM | POA: Diagnosis not present

## 2018-04-09 ENCOUNTER — Encounter: Payer: Self-pay | Admitting: Endocrinology

## 2018-04-09 ENCOUNTER — Ambulatory Visit (INDEPENDENT_AMBULATORY_CARE_PROVIDER_SITE_OTHER): Payer: BC Managed Care – PPO | Admitting: Endocrinology

## 2018-04-09 VITALS — BP 104/60 | HR 89 | Ht 64.0 in | Wt 159.2 lb

## 2018-04-09 DIAGNOSIS — E039 Hypothyroidism, unspecified: Secondary | ICD-10-CM | POA: Diagnosis not present

## 2018-04-09 LAB — T4, FREE: Free T4: 1.52 ng/dL (ref 0.60–1.60)

## 2018-04-09 LAB — TSH: TSH: 0.31 u[IU]/mL — ABNORMAL LOW (ref 0.35–4.50)

## 2018-04-09 NOTE — Progress Notes (Signed)
Subjective:    Patient ID: Laura Baxter, female    DOB: [birth year], 28 y.o.   MRN: [id number]  HPI Pt returns for f/u of hypothyroidism (dx'ed 2015; she cycled twice between hyper- and hypothyroidism until 2018, when she developed her current hypothyroidism; she takes synthroid; US showed thyroiditis; she has an IUD now; she had a normal ACTH stim test in 2019).  Dizziness is much better recently.  She is planning to have the IUD removed (in 10 days), in preparation for another pregnancy.   Past Medical History:  Diagnosis Date  . History of febrile seizure    INFANT  . History of hyperthyroidism    DX 2015--  TSH LEVELS NORMALIZED WITHOUT ANY TREATMENT  . History of hypotension   . History of ovarian cyst   . 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 Iron deficiency anemia   . Pilonidal cyst with abscess   . Wears glasses     Past Surgical History:  Procedure Laterality Date  . PILONIDAL CYST EXCISION N/A 06/17/2017   Procedure: CYST EXCISION PILONIDAL;  Surgeon: Sheliah Hatch, De Blanch, MD;  Location: [facility];  Service: General;  Laterality: N/A;  . SHOULDER SURGERY Left 2012   capsular repair    Social History   Socioeconomic History  . Marital status: Married    Spouse name: Not on file  . Number of children: Not on file  . Years of education: Not on file  . Highest education level: Not on file  Occupational History  . Not on file  Social Needs  . Financial resource strain: Not on file  . Food insecurity:    Worry: Not on file    Inability: Not on file  . Transportation needs:    Medical: Not on file    Non-medical: Not on file  Tobacco Use  . Smoking status: Never Smoker  . Smokeless tobacco: Never Used  Substance and Sexual Activity  . Alcohol use: No  . Drug use: No  . Sexual activity: Not on file    Comment: IUD placed 02/ 2019  Lifestyle  . Physical activity:    Days per week:  Not on file    Minutes per session: Not on file  . Stress: Not on file  Relationships  . Social connections:    Talks on phone: Not on file    Gets together: Not on file    Attends religious service: Not on file    Active member of club or organization: Not on file    Attends meetings of clubs or organizations: Not on file    Relationship status: Not on file  . Intimate partner violence:    Fear of current or ex partner: Not on file    Emotionally abused: Not on file    Physically abused: Not on file    Forced sexual activity: Not on file  Other Topics Concern  . Not on file  Social History Narrative  . Not on file    Current Outpatient Medications on File Prior to Visit  Medication Sig Dispense Refill  . levothyroxine (SYNTHROID, LEVOTHROID) 100 MCG tablet Take one tablet daily in the morning 90 tablet 3  . PARAGARD INTRAUTERINE COPPER IUD IUD 1 each by Intrauterine route once. Placed 02/ 2019    . SODIUM CHLORIDE PO Take 2 tablets by mouth daily. And as needed     No current facility-administered medications on file prior to visit.  Allergies  Allergen Reactions  . Iodine Rash  . Shellfish Allergy Rash  . Latex     Family History  Problem Relation Age of Onset  . Depression Mother   . Anemia Mother   . Heart attack Father   . Hypertension Father   . Hyperlipidemia Father   . Heart disease Father   . Depression Father   . Glaucoma Father   . Graves' disease Father   . Thyroid disease Father   . Cancer Maternal Grandmother        pancreatic  . Breast cancer Paternal Grandmother   . Leukemia Paternal Grandmother   . Leukemia Paternal Grandfather     BP 104/60 (BP Location: Left Arm, Patient Position: Sitting, Cuff Size: Normal)   Pulse 89   Ht 5\' 4"  (1.626 m)   Wt 159 lb 3.2 oz (72.2 kg)   SpO2 98%   BMI 27.33 kg/m    Review of Systems No weight change.     Objective:   Physical Exam VITAL SIGNS:  See vs page GENERAL: no distress NECK: There  is no palpable thyroid enlargement.  No thyroid nodule is palpable.  No palpable lymphadenopathy at the anterior neck.   Lab Results  Component Value Date   TSH 0.31 (L) 04/09/2018      Assessment & Plan:  Hypothyroidism: slightly overcontrolled.  However, I favor continuing same rx, as last TSH was much higher, and she is pursuing pregnancy.   Patient Instructions  blood tests are requested for you today.  We'll let you know about the results.   Please come back for a follow-up appointment in 3 months.  Please call sooner if the pregnancy happens.

## 2018-04-09 NOTE — Patient Instructions (Addendum)
blood tests are requested for you today.  We'll let you know about the results.   Please come back for a follow-up appointment in 3 months.  Please call sooner if the pregnancy happens.

## 2018-04-19 DIAGNOSIS — Z30432 Encounter for removal of intrauterine contraceptive device: Secondary | ICD-10-CM | POA: Diagnosis not present

## 2018-05-14 ENCOUNTER — Ambulatory Visit: Payer: BLUE CROSS/BLUE SHIELD | Admitting: Endocrinology

## 2018-07-07 ENCOUNTER — Other Ambulatory Visit: Payer: Self-pay

## 2018-07-09 ENCOUNTER — Ambulatory Visit: Payer: BLUE CROSS/BLUE SHIELD | Admitting: Endocrinology

## 2018-07-18 DIAGNOSIS — Z03818 Encounter for observation for suspected exposure to other biological agents ruled out: Secondary | ICD-10-CM | POA: Diagnosis not present

## 2018-07-18 DIAGNOSIS — R0602 Shortness of breath: Secondary | ICD-10-CM | POA: Diagnosis not present

## 2018-07-20 DIAGNOSIS — Z03818 Encounter for observation for suspected exposure to other biological agents ruled out: Secondary | ICD-10-CM | POA: Diagnosis not present

## 2018-07-20 DIAGNOSIS — R509 Fever, unspecified: Secondary | ICD-10-CM | POA: Diagnosis not present

## 2018-07-20 DIAGNOSIS — R05 Cough: Secondary | ICD-10-CM | POA: Diagnosis not present

## 2018-07-20 DIAGNOSIS — R0602 Shortness of breath: Secondary | ICD-10-CM | POA: Diagnosis not present

## 2018-07-22 ENCOUNTER — Encounter: Payer: Self-pay | Admitting: Endocrinology

## 2018-07-23 ENCOUNTER — Other Ambulatory Visit: Payer: Self-pay

## 2018-07-23 ENCOUNTER — Ambulatory Visit (INDEPENDENT_AMBULATORY_CARE_PROVIDER_SITE_OTHER): Payer: BC Managed Care – PPO | Admitting: Endocrinology

## 2018-07-23 DIAGNOSIS — E039 Hypothyroidism, unspecified: Secondary | ICD-10-CM | POA: Diagnosis not present

## 2018-07-23 NOTE — Patient Instructions (Addendum)
blood tests are requested for you today.  We'll let you know about the results.   Please come back for a follow-up appointment in 3 months.  Please call sooner if the pregnancy happens.

## 2018-07-23 NOTE — Progress Notes (Signed)
Subjective:    Patient ID: Laura Baxter, female    DOB: 10-Aug-1990, 28 y.o.   MRN: [id number]  HPI Pt returns for f/u of hypothyroidism (dx'ed 2015; she cycled twice between hyper- and hypothyroidism until 2018, when she developed her current hypothyroidism; she takes synthroid; US showed thyroiditis; she has an IUD now; she had a normal ACTH stim test in 2019).  pt states she feels better in general, since a recent illness.  pregnancy has not happened yet.   Past Medical History:  Diagnosis Date   History of febrile seizure    INFANT   History of hyperthyroidism    DX 2015--  TSH LEVELS NORMALIZED WITHOUT ANY TREATMENT   History of hypotension    History of ovarian cyst    Hypothyroidism, secondary ENDOCRINOLOGIST-  DR Everardo All   SECONDARY TO LYMPHOCYTIC THYROIDITIS   Iron deficiency anemia    Pilonidal cyst with abscess    Wears glasses     Past Surgical History:  Procedure Laterality Date   PILONIDAL CYST EXCISION N/A 06/17/2017   Procedure: CYST EXCISION PILONIDAL;  Surgeon: Kinsinger, De Blanch, MD;  Location: [facility];  Service: General;  Laterality: N/A;   SHOULDER SURGERY Left 2012   capsular repair    Social History   Socioeconomic History   Marital status: Married    Spouse name: Not on file   Number of children: Not on file   Years of education: Not on file   Highest education level: Not on file  Occupational History   Not on file  Social Needs   Financial resource strain: Not on file   Food insecurity:    Worry: Not on file    Inability: Not on file   Transportation needs:    Medical: Not on file    Non-medical: Not on file  Tobacco Use   Smoking status: Never Smoker   Smokeless tobacco: Never Used  Substance and Sexual Activity   Alcohol use: No   Drug use: No   Sexual activity: Not on file    Comment: IUD placed 02/ 2019  Lifestyle   Physical activity:    Days per week: Not on file    Minutes per  session: Not on file   Stress: Not on file  Relationships   Social connections:    Talks on phone: Not on file    Gets together: Not on file    Attends religious service: Not on file    Active member of club or organization: Not on file    Attends meetings of clubs or organizations: Not on file    Relationship status: Not on file   Intimate partner violence:    Fear of current or ex partner: Not on file    Emotionally abused: Not on file    Physically abused: Not on file    Forced sexual activity: Not on file  Other Topics Concern   Not on file  Social History Narrative   Not on file    Current Outpatient Medications on File Prior to Visit  Medication Sig Dispense Refill   levothyroxine (SYNTHROID, LEVOTHROID) 100 MCG tablet Take one tablet daily in the morning 90 tablet 3   SODIUM CHLORIDE PO Take 2 tablets by mouth daily. And as needed     No current facility-administered medications on file prior to visit.     Allergies  Allergen Reactions   Iodine Rash   Shellfish Allergy Rash   Latex  Family History  Problem Relation Age of Onset   Depression Mother    Anemia Mother    Heart attack Father    Hypertension Father    Hyperlipidemia Father    Heart disease Father    Depression Father    Glaucoma Father    Graves' disease Father    Thyroid disease Father    Cancer Maternal Grandmother        pancreatic   Breast cancer Paternal Grandmother    Leukemia Paternal Grandmother    Leukemia Paternal Grandfather      Review of Systems She has gained a few lbs.      Objective:   Physical Exam       Assessment & Plan:  Hypothyroidism: due for recheck Fertility: in this setting, she needs TSH in the normal range.   Patient Instructions  blood tests are requested for you today.  We'll let you know about the results.   Please come back for a follow-up appointment in 3 months.  Please call sooner if the pregnancy happens.

## 2018-08-02 ENCOUNTER — Other Ambulatory Visit: Payer: Self-pay

## 2018-08-02 ENCOUNTER — Other Ambulatory Visit (INDEPENDENT_AMBULATORY_CARE_PROVIDER_SITE_OTHER): Payer: BC Managed Care – PPO

## 2018-08-02 DIAGNOSIS — E039 Hypothyroidism, unspecified: Secondary | ICD-10-CM

## 2018-08-02 LAB — T4, FREE: Free T4: 1.05 ng/dL (ref 0.60–1.60)

## 2018-08-02 LAB — TSH: TSH: 1.06 u[IU]/mL (ref 0.35–4.50)

## 2018-08-16 ENCOUNTER — Ambulatory Visit
Admission: EM | Admit: 2018-08-16 | Discharge: 2018-08-16 | Disposition: A | Discharge status: Home / Self Care | Payer: BC Managed Care – PPO | Attending: Physician Assistant | Admitting: Physician Assistant

## 2018-08-16 ENCOUNTER — Other Ambulatory Visit: Payer: Self-pay

## 2018-08-16 DIAGNOSIS — S30861A Insect bite (nonvenomous) of abdominal wall, initial encounter: Secondary | ICD-10-CM | POA: Diagnosis not present

## 2018-08-16 DIAGNOSIS — W57XXXA Bitten or stung by nonvenomous insect and other nonvenomous arthropods, initial encounter: Secondary | ICD-10-CM

## 2018-08-16 MED ORDER — DOXYCYCLINE HYCLATE 100 MG PO CAPS: 100.0000 mg | ORAL_CAPSULE | Freq: Two times a day (BID) | ORAL | 0 refills | Status: DC

## 2018-08-16 NOTE — ED Provider Notes (Signed)
[facility]    CSN: [id number] Arrival date & time: 08/16/18  1142     History   Chief Complaint Chief Complaint  Patient presents with  . tick bite    HPI Laura Baxter is a 28 y.o. female.   28 year old female comes in for 4-day history of tick bite.  States she is unsure how long the tick had been present, but was engorged when patient removed the tick.  Patient noticed some neck pain last night, and came in for evaluation.  States tick bite area is slightly erythematous with swelling, but with she denies any fever, chills, night sweats.  Denies body aches, headaches.  Denies abdominal pain, nausea, vomiting.  Denies rashes.  States neck pain improved after waking up today without much pain.  Has not taken anything for symptoms.     Past Medical History:  Diagnosis Date  . History of febrile seizure    INFANT  . History of hyperthyroidism    DX 2015--  TSH LEVELS NORMALIZED WITHOUT ANY TREATMENT  . History of hypotension   . History of ovarian cyst   . Hypothyroidism, secondary ENDOCRINOLOGIST-  DR Everardo AllELLISON   SECONDARY TO LYMPHOCYTIC THYROIDITIS  . Iron deficiency anemia   . Pilonidal cyst with abscess   . Wears glasses     Patient Active Problem List   Diagnosis Date Noted  . Lightheadedness 01/11/2018  . Hypothyroidism 02/10/2017  . SVD (spontaneous vaginal delivery) 08/22/2016  . Obstetric labial laceration, delivered, current hospitalization 08/22/2016  . Obstetric vaginal laceration 08/22/2016  . Periurethral laceration, delivered, current hospitalization 08/22/2016  . Shoulder dystocia, delivered, current hospitalization 08/22/2016  . 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 Family history of cleft lip and palate 08/17/2016  . Post-dates pregnancy 08/17/2016  . Shellfish allergy 08/17/2016  . 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 PILONIDAL CYST EXCISION N/A 06/17/2017   Procedure: CYST EXCISION  PILONIDAL;  Surgeon: Sheliah HatchKinsinger, De BlanchLuke Aaron, MD;  [facility];  Service: General;  Laterality: N/A;  . SHOULDER SURGERY Left 2012   capsular repair    OB History    Gravida  2   Para  1   Term  1   Preterm      AB  1   Living  1     SAB  1   TAB      Ectopic      Multiple  0   Live Births  1            Home Medications    Prior to Admission medications   Medication Sig Start Date End Date Taking? Authorizing Provider  doxycycline (VIBRAMYCIN) 100 MG capsule Take 1 capsule (100 mg total) by mouth 2 (two) times daily. 08/16/18   Belinda FisherYu, Amy V, PA-C  levothyroxine (SYNTHROID, LEVOTHROID) 100 MCG tablet Take one tablet daily in the morning 11/19/17   Romero BellingEllison, Sean, MD  SODIUM CHLORIDE PO Take 2 tablets by mouth daily. And as needed    [provider]    Family History Family History  Problem Relation Age of Onset  . Depression Mother   . Anemia Mother   . Heart attack Father   . Hypertension Father   . Hyperlipidemia Father   . Heart disease Father   . Depression Father   . Glaucoma Father   . Graves' disease Father   . Thyroid disease Father   . Cancer Maternal Grandmother  pancreatic  . Breast cancer Paternal Grandmother   . Leukemia Paternal Grandmother   . Leukemia Paternal Grandfather     Social History Social History   Tobacco Use  . Smoking status: Never Smoker  . Smokeless tobacco: Never Used  Substance Use Topics  . Alcohol use: No  . Drug use: No     Allergies   Iodine, Shellfish allergy, and Latex   Review of Systems Review of Systems  Reason unable to perform ROS: See HPI as above.     Physical Exam Triage Vital Signs ED Triage Vitals  Enc Vitals Group     BP 08/16/18 1150 110/72     Pulse Rate 08/16/18 1150 84     Resp 08/16/18 1150 12     Temp 08/16/18 1150 98.4 F (36.9 C)     Temp Source 08/16/18 1150 Oral     SpO2 08/16/18 1150 96 %     Weight --      Height --      Head  Circumference --      Peak Flow --      Pain Score 08/16/18 1158 4     Pain Loc --      Pain Edu? --      Excl. in [place]? --    No data found.  Updated Vital Signs BP 110/72 (BP Location: Left Arm)   Pulse 84   Temp 98.4 F (36.9 C) (Oral)   Resp 12   LMP 08/10/2018   SpO2 96%   Physical Exam Constitutional:      General: She is not in acute distress.    Appearance: Normal appearance. She is not ill-appearing, toxic-appearing or diaphoretic.  HENT:     Head: Normocephalic and atraumatic.     Mouth/Throat:     Mouth: Mucous membranes are moist.     Pharynx: Oropharynx is clear. Uvula midline.  Neck:     Musculoskeletal: Normal range of motion and neck supple.  Cardiovascular:     Rate and Rhythm: Normal rate and regular rhythm.     Heart sounds: Normal heart sounds. No murmur. No friction rub. No gallop.   Pulmonary:     Effort: Pulmonary effort is normal. No accessory muscle usage, prolonged expiration, respiratory distress or retractions.     Comments: Lungs clear to auscultation without adventitious lung sounds. Musculoskeletal:     Comments: No tenderness to palpation of spinous processes.  No tenderness to palpation of neck, shoulder, elbow.  Full range of motion.  Strength normal and equal bilaterally.  Sensation intact and equal bilaterally.  Skin:    Comments: Small 0.3cm erythematous area to the right mid abdomen. No warmth, swelling, induration, fluctuance.  No tenderness to palpation.  Neurological:     General: No focal deficit present.     Mental Status: She is alert and oriented to person, place, and time.      UC Treatments / Results  Labs (all labs ordered are listed, but only abnormal results are displayed) Labs Reviewed - No data to display  EKG None  Radiology No results found.  Procedures Procedures (including critical care time)  Medications Ordered in UC Medications - No data to display  Initial Impression / Assessment and Plan / UC  Course  I have reviewed the triage vital signs and the nursing notes.  Pertinent labs & imaging results that were available during my care of the patient were reviewed by me and considered in my medical decision making (see  chart for details).    No alarming signs on exam.  Low suspicion for tickborne disease at this time.  Discussed empiric treatment with doxycycline versus monitoring.  Patient would like to monitor at this time.  Discussed symptoms to watch out for.  Written Rx of doxycycline provided, can start if noticing any symptoms to cover for tickborne disease.  Strict return precautions given.  Patient expresses understanding and agrees to plan.  Final Clinical Impressions(s) / UC Diagnoses   Final diagnoses:  Tick bite of abdomen, initial encounter   ED Prescriptions    Medication Sig Dispense Auth. Provider   doxycycline (VIBRAMYCIN) 100 MG capsule Take 1 capsule (100 mg total) by mouth 2 (two) times daily. 20 capsule Threasa AlphaYu, Amy V, PA-C       Yu, Amy V, [place]PA-C 08/16/18 1424

## 2018-08-16 NOTE — ED Triage Notes (Signed)
Patient complains of tick bite on abdomen X 4 days, patient did remove tick. Patient also complains of neck pain X 1 day.

## 2018-08-16 NOTE — ED Notes (Signed)
Patient complains of tick bite on abdomen X 4 days, patient did remove tick. Patient also complains of neck pain X 1 day.  

## 2018-08-16 NOTE — Discharge Instructions (Signed)
As discussed, no alarming signs at this time. Continue to monitor symptoms. I have attached information of symptoms to watch out for. Take ibuprofen/naproxen for neck soreness. If you noticed spreading redness, warmth, increased swelling, pain to the site, you can fill the doxycycline for skin infection (cellulitis). If you notice any symptoms on attached document, can fill the doxycycline for possible tick borne disease.

## 2018-09-28 DIAGNOSIS — E559 Vitamin D deficiency, unspecified: Secondary | ICD-10-CM | POA: Diagnosis not present

## 2018-09-28 DIAGNOSIS — E210 Primary hyperparathyroidism: Secondary | ICD-10-CM | POA: Diagnosis not present

## 2018-09-28 DIAGNOSIS — N189 Chronic kidney disease, unspecified: Secondary | ICD-10-CM | POA: Diagnosis not present

## 2018-10-13 ENCOUNTER — Other Ambulatory Visit: Payer: Self-pay

## 2018-10-15 ENCOUNTER — Ambulatory Visit: Payer: BC Managed Care – PPO | Admitting: Endocrinology

## 2018-10-15 ENCOUNTER — Encounter: Payer: Self-pay | Admitting: Endocrinology

## 2018-10-15 ENCOUNTER — Other Ambulatory Visit: Payer: Self-pay

## 2018-10-15 VITALS — BP 100/68 | HR 88 | Ht 64.0 in | Wt 161.4 lb

## 2018-10-15 DIAGNOSIS — E039 Hypothyroidism, unspecified: Secondary | ICD-10-CM

## 2018-10-15 LAB — T4, FREE: Free T4: 1.34 ng/dL (ref 0.60–1.60)

## 2018-10-15 LAB — TSH: TSH: 0.85 u[IU]/mL (ref 0.35–4.50)

## 2018-10-15 NOTE — Progress Notes (Signed)
Subjective:    Patient ID: Laura MiuLisa Grigley, female    DOB: Dec 29, 1990, 29 y.o.   MRN: [id number]  HPI Pt returns for f/u of hypothyroidism (dx'ed 2015; she cycled twice between hyper- and hypothyroidism until 2018, when she developed her current hypothyroidism; she takes synthroid; US showed thyroiditis; she has an IUD now; she had a normal ACTH stim test in 2019).   pregnancy has not happened yet. Main symptom is difficulty with concentration.   Past Medical History:  Diagnosis Date  . History of febrile seizure    INFANT  . History of hyperthyroidism    DX 2015--  TSH LEVELS NORMALIZED WITHOUT ANY TREATMENT  . History of hypotension   . History of ovarian cyst   . Hypothyroidism, secondary ENDOCRINOLOGIST-  DR Everardo AllELLISON   SECONDARY TO LYMPHOCYTIC THYROIDITIS  . Iron deficiency anemia   . Pilonidal cyst with abscess   . Wears glasses     Past Surgical History:  Procedure Laterality Date  . PILONIDAL CYST EXCISION N/A 06/17/2017   Procedure: CYST EXCISION PILONIDAL;  Surgeon: Sheliah HatchKinsinger, De BlanchLuke Aaron, MD;  [facility] [place];  Service: General;  Laterality: N/A;  . SHOULDER SURGERY Left 2012   capsular repair    Social History   Socioeconomic History  . Marital status: Married    Spouse name: Not on file  . Number of children: Not on file  . Years of education: Not on file  . Highest education level: Not on file  Occupational History  . Not on file  Social Needs  . Financial resource strain: Not on file  . Food insecurity    Worry: Not on file    Inability: Not on file  . Transportation needs    Medical: Not on file    Non-medical: Not on file  Tobacco Use  . Smoking status: Never Smoker  . Smokeless tobacco: Never Used  Substance and Sexual Activity  . Alcohol use: No  . Drug use: No  . Sexual activity: Not on file    Comment: IUD placed 02/ 2019  Lifestyle  . Physical activity    Days per week: Not on file    Minutes per session: Not on file  .  Stress: Not on file  Relationships  . Social Musicianconnections    Talks on phone: Not on file    Gets together: Not on file    Attends religious service: Not on file    Active member of club or organization: Not on file    Attends meetings of clubs or organizations: Not on file    Relationship status: Not on file  . Intimate partner violence    Fear of current or ex partner: Not on file    Emotionally abused: Not on file    Physically abused: Not on file    Forced sexual activity: Not on file  Other Topics Concern  . Not on file  Social History Narrative  . Not on file    Current Outpatient Medications on File Prior to Visit  Medication Sig Dispense Refill  . levothyroxine (SYNTHROID, LEVOTHROID) 100 MCG tablet Take one tablet daily in the morning 90 tablet 3  . SODIUM CHLORIDE PO Take 2 tablets by mouth daily as needed.      No current facility-administered medications on file prior to visit.     Allergies  Allergen Reactions  . Iodine Rash  . Shellfish Allergy Rash  . Latex     Family History  Problem  Relation Age of Onset  . Depression Mother   . Anemia Mother   . Heart attack Father   . Hypertension Father   . Hyperlipidemia Father   . Heart disease Father   . Depression Father   . Glaucoma Father   . Graves' disease Father   . Thyroid disease Father   . Cancer Maternal Grandmother        pancreatic  . Breast cancer Paternal Grandmother   . Leukemia Paternal Grandmother   . Leukemia Paternal Grandfather     BP 100/68 (BP Location: Left Arm, Patient Position: Sitting, Cuff Size: Normal)   Pulse 88   Ht 5\' 4"  (1.626 m)   Wt 161 lb 6.4 oz (73.2 kg)   SpO2 99%   BMI 27.70 kg/m    Review of Systems Denies neck swelling.      Objective:   Physical Exam VITAL SIGNS:  See vs page GENERAL: no distress NECK:thyroid is slightly and diffusely enlarged.  No thyroid nodule is palpable.  No palpable lymphadenopathy at the anterior neck.    Lab Results   Component Value Date   TSH 0.85 10/15/2018      Assessment & Plan:  Hypothyroidism: well-replaced Infertility: goal TSH here is low-normal.  Patient Instructions  blood tests are requested for you today.  We'll let you know about the results.   Please come back for a follow-up appointment in 3-4 months.  Please call sooner if the pregnancy happens.

## 2018-10-15 NOTE — Patient Instructions (Signed)
blood tests are requested for you today.  We'll let you know about the results.   Please come back for a follow-up appointment in 3-4 months.  Please call sooner if the pregnancy happens.

## 2018-11-03 DIAGNOSIS — R102 Pelvic and perineal pain: Secondary | ICD-10-CM | POA: Diagnosis not present

## 2018-11-03 DIAGNOSIS — Z319 Encounter for procreative management, unspecified: Secondary | ICD-10-CM | POA: Diagnosis not present

## 2018-12-15 DIAGNOSIS — Z6828 Body mass index (BMI) 28.0-28.9, adult: Secondary | ICD-10-CM | POA: Diagnosis not present

## 2018-12-15 DIAGNOSIS — R102 Pelvic and perineal pain: Secondary | ICD-10-CM | POA: Diagnosis not present

## 2018-12-15 DIAGNOSIS — Z01411 Encounter for gynecological examination (general) (routine) with abnormal findings: Secondary | ICD-10-CM | POA: Diagnosis not present

## 2018-12-21 ENCOUNTER — Other Ambulatory Visit: Payer: Self-pay

## 2018-12-21 ENCOUNTER — Telehealth: Payer: Self-pay | Admitting: Endocrinology

## 2018-12-21 DIAGNOSIS — E039 Hypothyroidism, unspecified: Secondary | ICD-10-CM

## 2018-12-21 MED ORDER — LEVOTHYROXINE SODIUM 100 MCG PO TABS: 100.0000 ug | ORAL_TABLET | Freq: Every day | ORAL | 1 refills | Status: DC

## 2018-12-21 NOTE — Telephone Encounter (Signed)
levothyroxine (SYNTHROID) 100 MCG tablet 30 tablet 1 12/21/2018    Sig - Route: Take 1 tablet (100 mcg total) by mouth daily before breakfast. Take one tablet daily in the morning - Oral   Sent to pharmacy as: levothyroxine (SYNTHROID) 100 MCG tablet   E-Prescribing Status: Receipt confirmed by pharmacy (12/21/2018 2:54 PM EDT)

## 2018-12-21 NOTE — Telephone Encounter (Signed)
MEDICATION: levothyroxine (SYNTHROID, LEVOTHROID) 100 MCG tablet  PHARMACY:   [facility], [place] [phone] (Phone) [phone] (Fax)    IS THIS A 90 DAY SUPPLY : Yes  IS PATIENT OUT OF MEDICATION: No  IF NOT; HOW MUCH IS LEFT: 4 tablets  LAST APPOINTMENT DATE: @8 /21/2020  NEXT APPOINTMENT DATE:@12 /12/2018  DO WE HAVE YOUR PERMISSION TO LEAVE A DETAILED MESSAGE: Yes  OTHER COMMENTS:    **Let patient know to contact pharmacy at the end of the day to make sure medication is ready. **  ** Please notify patient to allow 48-72 hours to process**  **Encourage patient to contact the pharmacy for refills or they can request refills through [facility]**

## 2018-12-25 DIAGNOSIS — S93492A Sprain of other ligament of left ankle, initial encounter: Secondary | ICD-10-CM | POA: Diagnosis not present

## 2018-12-30 ENCOUNTER — Other Ambulatory Visit: Payer: Self-pay

## 2018-12-30 ENCOUNTER — Telehealth: Payer: Self-pay | Admitting: Endocrinology

## 2018-12-30 DIAGNOSIS — E039 Hypothyroidism, unspecified: Secondary | ICD-10-CM

## 2018-12-30 MED ORDER — LEVOTHYROXINE SODIUM 100 MCG PO TABS
100.0000 ug | ORAL_TABLET | Freq: Every day | ORAL | 1 refills | Status: DC
Start: 2018-12-30 — End: 2019-02-05

## 2018-12-30 NOTE — Telephone Encounter (Signed)
MEDICATION: Levothyroxine 100 MCG  PHARMACY:  Ins[facility] claims handler on [place] :    IS PATIENT OUT OF MEDICATION: yes - see below  IF NOT; HOW MUCH IS LEFT:   LAST APPOINTMENT DATE: @10 /27/2020  NEXT APPOINTMENT DATE:@12 /12/2018  DO WE HAVE YOUR PERMISSION TO LEAVE A DETAILED MESSAGE:  OTHER COMMENTS: patient picked up last refill from pharmacy, but it has been lost and she is wondering if we can fill it again to last her until her visit 02/04/2019   **Let patient know to contact pharmacy at the end of the day to make sure medication is ready. **  ** Please notify patient to allow 48-72 hours to process**  **Encourage patient to contact the pharmacy for refills or they can request refills through [facility]**

## 2018-12-30 NOTE — Telephone Encounter (Signed)
Per Dr. Cordelia Pen request, please ensure refills are routed directly to the nurse.  levothyroxine (SYNTHROID) 100 MCG tablet 30 tablet 1 12/30/2018    Sig - Route: Take 1 tablet (100 mcg total) by mouth daily before breakfast. Take one tablet daily in the morning - Oral   Sent to pharmacy as: levothyroxine (SYNTHROID) 100 MCG tablet   E-Prescribing Status: Receipt confirmed by pharmacy (12/30/2018 4:25 PM EST)

## 2018-12-30 NOTE — Telephone Encounter (Signed)
Please refill prn 

## 2019-01-19 DIAGNOSIS — J069 Acute upper respiratory infection, unspecified: Secondary | ICD-10-CM | POA: Diagnosis not present

## 2019-01-19 DIAGNOSIS — Z20828 Contact with and (suspected) exposure to other viral communicable diseases: Secondary | ICD-10-CM | POA: Diagnosis not present

## 2019-01-19 DIAGNOSIS — Z9189 Other specified personal risk factors, not elsewhere classified: Secondary | ICD-10-CM | POA: Diagnosis not present

## 2019-02-02 ENCOUNTER — Other Ambulatory Visit: Payer: Self-pay

## 2019-02-04 ENCOUNTER — Other Ambulatory Visit: Payer: Self-pay

## 2019-02-04 ENCOUNTER — Ambulatory Visit: Payer: BC Managed Care – PPO | Admitting: Endocrinology

## 2019-02-04 ENCOUNTER — Encounter: Payer: Self-pay | Admitting: Endocrinology

## 2019-02-04 VITALS — BP 114/70 | HR 105 | Ht 64.0 in | Wt 165.2 lb

## 2019-02-04 DIAGNOSIS — E039 Hypothyroidism, unspecified: Secondary | ICD-10-CM | POA: Diagnosis not present

## 2019-02-04 LAB — T4, FREE: Free T4: 1.25 ng/dL (ref 0.60–1.60)

## 2019-02-04 LAB — TSH: TSH: 0.34 u[IU]/mL — ABNORMAL LOW (ref 0.35–4.50)

## 2019-02-04 NOTE — Patient Instructions (Signed)
Blood tests are requested for you today.  We'll let you know about the results.  Please come back for a follow-up appointment in 3 months.   

## 2019-02-04 NOTE — Progress Notes (Signed)
Subjective:    Patient ID: Laura Baxter, female    DOB: [birth year], 28 y.o.   MRN: [id number]  HPI Pt returns for f/u of hypothyroidism (dx'ed 2015; she cycled twice between hyper- and hypothyroidism until 2018, when she developed her current hypothyroidism; she takes synthroid; US showed thyroiditis; she has an IUD now; she had a normal ACTH stim test in 2019).  She is at [redacted] weeks gestation.  pt states she feels well in general.   Past Medical History:  Diagnosis Date  . History of febrile seizure    INFANT  . History of hyperthyroidism    DX 2015--  TSH LEVELS NORMALIZED WITHOUT ANY TREATMENT  . History of hypotension   . History of ovarian cyst   . Hypothyroidism, secondary ENDOCRINOLOGIST-  DR Loanne Drilling   SECONDARY TO LYMPHOCYTIC THYROIDITIS  . Iron deficiency anemia   . Pilonidal cyst with abscess   . Wears glasses     Past Surgical History:  Procedure Laterality Date  . PILONIDAL CYST EXCISION N/A 06/17/2017   Procedure: CYST EXCISION PILONIDAL;  Surgeon: Kieth Brightly, Arta Bruce, MD;  Location: [place];  Service: General;  Laterality: N/A;  . SHOULDER SURGERY Left 2012   capsular repair    Social History   Socioeconomic History  . Marital status: Married    Spouse name: Not on file  . Number of children: Not on file  . Years of education: Not on file  . Highest education level: Not on file  Occupational History  . Not on file  Tobacco Use  . Smoking status: Never Smoker  . Smokeless tobacco: Never Used  Substance and Sexual Activity  . Alcohol use: No  . Drug use: No  . Sexual activity: Not on file    Comment: IUD placed 02/ 2019  Other Topics Concern  . Not on file  Social History Narrative  . Not on file   Social Determinants of Health   Financial Resource Strain:   . Difficulty of Paying Living Expenses: Not on file  Food Insecurity:   . Worried About Charity fundraiser in the Last Year: Not on file  . Ran Out of Food in the Last  Year: Not on file  Transportation Needs:   . Lack of Transportation (Medical): Not on file  . Lack of Transportation (Non-Medical): Not on file  Physical Activity:   . Days of Exercise per Week: Not on file  . Minutes of Exercise per Session: Not on file  Stress:   . Feeling of Stress : Not on file  Social Connections:   . Frequency of Communication with Friends and Family: Not on file  . Frequency of Social Gatherings with Friends and Family: Not on file  . Attends Religious Services: Not on file  . Active Member of Clubs or Organizations: Not on file  . Attends Archivist Meetings: Not on file  . Marital Status: Not on file  Intimate Partner Violence:   . Fear of Current or Ex-Partner: Not on file  . Emotionally Abused: Not on file  . Physically Abused: Not on file  . Sexually Abused: Not on file    Current Outpatient Medications on File Prior to Visit  Medication Sig Dispense Refill  . SODIUM CHLORIDE PO Take 2 tablets by mouth daily as needed.      No current facility-administered medications on file prior to visit.    Allergies  Allergen Reactions  . Iodine Rash  . Shellfish  Allergy Rash  . Latex     Family History  Problem Relation Age of Onset  . Depression Mother   . Anemia Mother   . Heart attack Father   . Hypertension Father   . Hyperlipidemia Father   . Heart disease Father   . Depression Father   . Glaucoma Father   . Graves' disease Father   . Thyroid disease Father   . Cancer Maternal Grandmother        pancreatic  . Breast cancer Paternal Grandmother   . Leukemia Paternal Grandmother   . Leukemia Paternal Grandfather     BP 114/70 (BP Location: Right Arm, Patient Position: Sitting, Cuff Size: Normal)   Pulse (!) 105   Ht 5\' 4"  (1.626 m)   Wt 165 lb 3.2 oz (74.9 kg)   SpO2 98%   BMI 28.36 kg/m    Review of Systems Denies n/v.      Objective:   Physical Exam VITAL SIGNS:  See vs page GENERAL: no distress NECK: There is no  palpable thyroid is slightly enlarged (R>L).  No thyroid nodule is palpable.  No palpable lymphadenopathy at the anterior neck.   Lab Results  Component Value Date   TSH 0.34 (L) 02/04/2019      Assessment & Plan:  Hypothyroidism, minimally overreplaced.  However, thyroid hormone requirement is expected to increase during pregnancy, so we'll continue same rx.  Patient Instructions  Blood tests are requested for you today.  We'll let you know about the results.   Please come back for a follow-up appointment in 3 months.

## 2019-02-05 MED ORDER — LEVOTHYROXINE SODIUM 100 MCG PO TABS: 100.0000 ug | ORAL_TABLET | Freq: Every day | ORAL | 2 refills | Status: DC

## 2019-02-07 ENCOUNTER — Telehealth: Payer: Self-pay | Admitting: Endocrinology

## 2019-02-07 NOTE — Telephone Encounter (Signed)
I have Haslet calling to get clarification on an RX for the Levothyroxine  Call back number is [phone]

## 2019-02-07 NOTE — Telephone Encounter (Signed)
Hi Ms. Laura Baxter: The medication is a tiny bit too much.  However, you should continue the same medication for 2 reasons: 1 is that in pregnancy, we guard against too little medication.  The second is that your medication need will probably go up over your pregnancy.  I'll see you next time.

## 2019-02-08 DIAGNOSIS — N925 Other specified irregular menstruation: Secondary | ICD-10-CM | POA: Diagnosis not present

## 2019-02-10 LAB — OB RESULTS CONSOLE GC/CHLAMYDIA
Chlamydia: NEGATIVE
Gonorrhea: NEGATIVE

## 2019-02-10 LAB — OB RESULTS CONSOLE HIV ANTIBODY (ROUTINE TESTING): HIV: NONREACTIVE

## 2019-02-10 LAB — OB RESULTS CONSOLE RUBELLA ANTIBODY, IGM: Rubella: IMMUNE

## 2019-02-10 LAB — OB RESULTS CONSOLE RPR: RPR: NONREACTIVE

## 2019-02-10 LAB — OB RESULTS CONSOLE HEPATITIS B SURFACE ANTIGEN: Hepatitis B Surface Ag: NEGATIVE

## 2019-02-25 NOTE — L&D Delivery Note (Addendum)
Delivery Note  Patient Name: Laura Baxter DOB: 1990-05-10 MRN: [id number]  Date of admission: 09/06/2019 Delivering MD: Dorisann Frames K  Date of delivery: 09/06/2019 Type of delivery: SVD  Newborn Data: Live born female  Birth Weight:  pending APGAR: 9, 9  Newborn Delivery   Birth date/time: 09/06/2019 22:41:00 Delivery type: Vaginal, Spontaneous     Cranford Mon, 29 y.o., @ [redacted]w[redacted]d,  [phone], who was admitted for PROM. I was called to the room when she progressed +2 station in the second stage of labor.  She pushed for 3 min.  She delivered a viable infant, cephalic and restituted to the LOA position over an intact perineum.  A nuchal cord   was identified x1. The baby was placed on maternal abdomen while initial step of NRP were perfmored (Dry, Stimulated, and warmed). Hat placed on baby for thermoregulation. Delayed cord clamping was performed for 1 minute.  Cord double clamped and cut.  Cord cut by FOB. Apgar scores were 8 and 9. Prophylactic Pitocin was started in the third stage of labor for active management. The placenta delivered spontaneously, shultz, with a 3 vessel cord and was sent to LD.  Inspection revealed periurethral. An examination of the vaginal vault and cervix was free from lacerations. The uterus was firm, bleeding stable.  The laceration was hemostatic and did not require repair. Placenta and umbilical artery blood gas were not sent.  There were no complications during the procedure.  Mom and baby skin to skin following delivery. Left in stable condition.  Maternal Info: Anesthesia:Epidural Episiotomy: N/A Lacerations:  Periurethral Suture Repair: Hemostatic, no repair Est. Blood Loss (mL):  200  Newborn Info: Baby Sex: female Babies Name: [name] APGAR (1 MIN):  9 APGAR (5 MINS):  9  Mom to postpartum.  Baby to Couplet care / Skin to Skin.  Delivery Report:  Review the Delivery Report for details.     Signed: Clancy Gourd,  MSN 09/06/2019, 11:07 PM

## 2019-03-08 DIAGNOSIS — Z23 Encounter for immunization: Secondary | ICD-10-CM | POA: Diagnosis not present

## 2019-03-08 DIAGNOSIS — Z3401 Encounter for supervision of normal first pregnancy, first trimester: Secondary | ICD-10-CM | POA: Diagnosis not present

## 2019-03-08 DIAGNOSIS — Z3A12 12 weeks gestation of pregnancy: Secondary | ICD-10-CM | POA: Diagnosis not present

## 2019-03-08 DIAGNOSIS — R81 Glycosuria: Secondary | ICD-10-CM | POA: Diagnosis not present

## 2019-03-25 DIAGNOSIS — E039 Hypothyroidism, unspecified: Secondary | ICD-10-CM | POA: Diagnosis not present

## 2019-03-25 DIAGNOSIS — R5382 Chronic fatigue, unspecified: Secondary | ICD-10-CM | POA: Diagnosis not present

## 2019-03-25 DIAGNOSIS — R031 Nonspecific low blood-pressure reading: Secondary | ICD-10-CM | POA: Diagnosis not present

## 2019-03-25 DIAGNOSIS — R55 Syncope and collapse: Secondary | ICD-10-CM | POA: Diagnosis not present

## 2019-03-28 ENCOUNTER — Telehealth: Payer: Self-pay

## 2019-03-28 NOTE — Telephone Encounter (Signed)
Company: Radio broadcast assistant, [place]  Document: Medical records request  Forwarded above request to HIM. Document and fax confirmation have been placed in the faxed file for future reference.

## 2019-04-05 DIAGNOSIS — Z3493 Encounter for supervision of normal pregnancy, unspecified, third trimester: Secondary | ICD-10-CM | POA: Diagnosis not present

## 2019-04-05 DIAGNOSIS — R1011 Right upper quadrant pain: Secondary | ICD-10-CM | POA: Diagnosis not present

## 2019-04-27 DIAGNOSIS — Z13220 Encounter for screening for lipoid disorders: Secondary | ICD-10-CM | POA: Diagnosis not present

## 2019-04-27 DIAGNOSIS — Z0000 Encounter for general adult medical examination without abnormal findings: Secondary | ICD-10-CM | POA: Diagnosis not present

## 2019-04-27 DIAGNOSIS — R5382 Chronic fatigue, unspecified: Secondary | ICD-10-CM | POA: Diagnosis not present

## 2019-05-04 DIAGNOSIS — Z3A21 21 weeks gestation of pregnancy: Secondary | ICD-10-CM | POA: Diagnosis not present

## 2019-05-04 DIAGNOSIS — Z363 Encounter for antenatal screening for malformations: Secondary | ICD-10-CM | POA: Diagnosis not present

## 2019-05-13 ENCOUNTER — Other Ambulatory Visit: Payer: Self-pay

## 2019-05-13 ENCOUNTER — Encounter: Payer: Self-pay | Admitting: Endocrinology

## 2019-05-13 ENCOUNTER — Ambulatory Visit: Payer: BC Managed Care – PPO | Admitting: Endocrinology

## 2019-05-13 VITALS — BP 100/60 | HR 100 | Ht 64.0 in | Wt 168.6 lb

## 2019-05-13 DIAGNOSIS — E039 Hypothyroidism, unspecified: Secondary | ICD-10-CM | POA: Diagnosis not present

## 2019-05-13 NOTE — Patient Instructions (Signed)
Blood tests are requested for you today.  We'll let you know about the results.   It is best to never miss the medication.  However, if you do miss it, next best is to double up the next time.   Please come back for a follow-up appointment in 2-3 months.

## 2019-05-13 NOTE — Progress Notes (Signed)
Subjective:    Patient ID: Laura Baxter, female    DOB: [birth year], 29 y.o.   MRN: [id number]  HPI Pt returns for f/u of hypothyroidism (dx'ed 2015; she cycled twice between hyper- and hypothyroidism until 2018, when she developed her current hypothyroidism; she takes synthroid; US showed thyroiditis; she has an IUD now; she had a normal ACTH stim test in 2019).  She is at [redacted] weeks gestation.  pt states she feels well in general, except for fatigue.    Past Medical History:  Diagnosis Date  . History of febrile seizure    INFANT  . History of hyperthyroidism    DX 2015--  TSH LEVELS NORMALIZED WITHOUT ANY TREATMENT  . History of hypotension   . History of ovarian cyst   . Hypothyroidism, secondary ENDOCRINOLOGIST-  DR Loanne Drilling   SECONDARY TO LYMPHOCYTIC THYROIDITIS  . Iron deficiency anemia   . Pilonidal cyst with abscess   . Wears glasses     Past Surgical History:  Procedure Laterality Date  . PILONIDAL CYST EXCISION N/A 06/17/2017   Procedure: CYST EXCISION PILONIDAL;  Surgeon: Kieth Brightly, Arta Bruce, MD;  Location: [place];  Service: General;  Laterality: N/A;  . SHOULDER SURGERY Left 2012   capsular repair    Social History   Socioeconomic History  . Marital status: Married    Spouse name: Not on file  . Number of children: Not on file  . Years of education: Not on file  . Highest education level: Not on file  Occupational History  . Not on file  Tobacco Use  . Smoking status: Never Smoker  . Smokeless tobacco: Never Used  Substance and Sexual Activity  . Alcohol use: No  . Drug use: No  . Sexual activity: Not on file    Comment: IUD placed 02/ 2019  Other Topics Concern  . Not on file  Social History Narrative  . Not on file   Social Determinants of Health   Financial Resource Strain:   . Difficulty of Paying Living Expenses:   Food Insecurity:   . Worried About Charity fundraiser in the Last Year:   . Arboriculturist in the Last  Year:   Transportation Needs:   . Film/video editor (Medical):   Marland Kitchen Lack of Transportation (Non-Medical):   Physical Activity:   . Days of Exercise per Week:   . Minutes of Exercise per Session:   Stress:   . Feeling of Stress :   Social Connections:   . Frequency of Communication with Friends and Family:   . Frequency of Social Gatherings with Friends and Family:   . Attends Religious Services:   . Active Member of Clubs or Organizations:   . Attends Archivist Meetings:   Marland Kitchen Marital Status:   Intimate Partner Violence:   . Fear of Current or Ex-Partner:   . Emotionally Abused:   Marland Kitchen Physically Abused:   . Sexually Abused:     Current Outpatient Medications on File Prior to Visit  Medication Sig Dispense Refill  . acetaminophen (TYLENOL) 325 MG tablet Take 2 tablets by mouth as needed for pain.    Marland Kitchen levothyroxine (SYNTHROID) 100 MCG tablet Take 1 tablet (100 mcg total) by mouth daily. 90 tablet 2  . loratadine (CLARITIN) 10 MG tablet Take 10 mg by mouth daily as needed for allergies.    Marland Kitchen OVER THE COUNTER MEDICATION Take 3 each by mouth daily. Prenatal gummies    .  SODIUM CHLORIDE PO Take 2 tablets by mouth daily as needed.      No current facility-administered medications on file prior to visit.    Allergies  Allergen Reactions  . Iodine Rash  . Shellfish Allergy Rash  . Latex     Family History  Problem Relation Age of Onset  . Depression Mother   . Anemia Mother   . Heart attack Father   . Hypertension Father   . Hyperlipidemia Father   . Heart disease Father   . Depression Father   . Glaucoma Father   . Graves' disease Father   . Thyroid disease Father   . Cancer Maternal Grandmother        pancreatic  . Breast cancer Paternal Grandmother   . Leukemia Paternal Grandmother   . Leukemia Paternal Grandfather     BP 100/60   Pulse 100   Ht 5\' 4"  (1.626 m)   Wt 168 lb 9.6 oz (76.5 kg)   SpO2 98%   BMI 28.94 kg/m    Review of Systems She  has gained 4 lbs since last ov.      Objective:   Physical Exam VITAL SIGNS:  See vs page GENERAL: no distress.  NECK: thyroid is slightly enlarged (R>L)  No thyroid nodule is palpable.  No palpable lymphadenopathy at the anterior neck.     Lab Results  Component Value Date   TSH 0.618 05/13/2019      Assessment & Plan:  Hypothyroidism: well-replaced Pregnancy: she needs to keep TSH low-normal.   Patient Instructions  Blood tests are requested for you today.  We'll let you know about the results.   It is best to never miss the medication.  However, if you do miss it, next best is to double up the next time.   Please come back for a follow-up appointment in 2-3 months.

## 2019-05-14 LAB — TSH: TSH: 0.618 u[IU]/mL (ref 0.450–4.500)

## 2019-05-14 LAB — T4, FREE: Free T4: 1.2 ng/dL (ref 0.82–1.77)

## 2019-05-31 DIAGNOSIS — R350 Frequency of micturition: Secondary | ICD-10-CM | POA: Diagnosis not present

## 2019-06-03 DIAGNOSIS — O479 False labor, unspecified: Secondary | ICD-10-CM | POA: Diagnosis not present

## 2019-06-04 ENCOUNTER — Inpatient Hospital Stay (HOSPITAL_COMMUNITY)
Admission: AD | Admit: 2019-06-04 | Discharge: 2019-06-04 | Disposition: A | Discharge status: Home / Self Care | Payer: BC Managed Care – PPO | Attending: Obstetrics and Gynecology | Admitting: Obstetrics and Gynecology

## 2019-06-04 ENCOUNTER — Encounter (HOSPITAL_COMMUNITY): Payer: Self-pay | Admitting: Obstetrics & Gynecology

## 2019-06-04 ENCOUNTER — Other Ambulatory Visit: Payer: Self-pay

## 2019-06-04 DIAGNOSIS — O4702 False labor before 37 completed weeks of gestation, second trimester: Secondary | ICD-10-CM | POA: Insufficient documentation

## 2019-06-04 DIAGNOSIS — O479 False labor, unspecified: Secondary | ICD-10-CM

## 2019-06-04 DIAGNOSIS — O368121 Decreased fetal movements, second trimester, fetus 1: Secondary | ICD-10-CM | POA: Diagnosis not present

## 2019-06-04 DIAGNOSIS — Z3A25 25 weeks gestation of pregnancy: Secondary | ICD-10-CM | POA: Insufficient documentation

## 2019-06-04 DIAGNOSIS — O368120 Decreased fetal movements, second trimester, not applicable or unspecified: Secondary | ICD-10-CM | POA: Diagnosis not present

## 2019-06-04 LAB — URINALYSIS, ROUTINE W REFLEX MICROSCOPIC
Bilirubin Urine: NEGATIVE
Glucose, UA: 50 mg/dL — AB
Hgb urine dipstick: NEGATIVE
Ketones, ur: NEGATIVE mg/dL
Nitrite: NEGATIVE
Protein, ur: NEGATIVE mg/dL
Specific Gravity, Urine: 1.009 (ref 1.005–1.030)
pH: 8 (ref 5.0–8.0)

## 2019-06-04 MED ORDER — LACTATED RINGERS IV BOLUS
1000.0000 mL | Freq: Once | INTRAVENOUS | Status: AC
  Administered 2019-06-04: 1000 mL via INTRAVENOUS

## 2019-06-04 NOTE — MAU Note (Signed)
Laura Baxter is a 29 y.o. at [redacted]w[redacted]d here in MAU reporting: DFM today, states she has felt some but less than normal. Has been having contractions for the past 3 weeks, had FFN done in the office yesterday and it was negative. Feels them a couple of times an hours. Denies LOF. Having a little bit of spotting, states it started before her visit in the office yesterday. Had some diarrhea today, 1 episode.   Onset of complaint: ongoing  Pain score: 4/10  Vitals:   06/04/19 1720  BP: 107/65  Pulse: 89  Resp: 16  Temp: 98.8 F (37.1 C)  SpO2: 98%     FHT: 152  Lab orders placed from triage: UA

## 2019-06-04 NOTE — Discharge Instructions (Signed)

## 2019-06-04 NOTE — MAU Provider Note (Signed)
History     CSN: [id number]  Arrival date and time: 06/04/19 1653   First Provider Initiated Contact with Patient 06/04/19 1738      Chief Complaint  Patient presents with  . Contractions  . Decreased Fetal Movement   HPI Laura Baxter is a 29 y.o. G3P1011 at [redacted]w[redacted]d who presents with decreased fetal movement and contractions. She states she hasn't been feeling the baby move as much today as normal. Upon arrival to MAU, she is reporting normal fetal movement.   She reports having increasing contractions for the last 3-4 weeks. She was seen in her office yesterday and reports having a negative FFN and cervix was closed but soft. She reports 3-7 contractions every hour and reports that only some of them are painful. She denies any leaking of fluid, vaginal bleeding or discharge.   She gets prenatal care at [facility] and denies any problems in the pregnancy.   OB History     Gravida  3   Para  1   Term  1   Preterm      AB  1   Living  1      SAB  1   TAB      Ectopic      Multiple  0   Live Births  1           Past Medical History:  Diagnosis Date  . History of febrile seizure    INFANT  . History of hyperthyroidism    DX 2015--  TSH LEVELS NORMALIZED WITHOUT ANY TREATMENT  . History of hypotension   . History of ovarian cyst   . 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 Iron deficiency anemia   . Pilonidal cyst with abscess   . Wears glasses     Past Surgical History:  Procedure Laterality Date  . PILONIDAL CYST EXCISION N/A 06/17/2017   Procedure: CYST EXCISION PILONIDAL;  Surgeon: Sheliah Hatch, De Blanch, MD;  Location: [facility];  Service: General;  Laterality: N/A;  . SHOULDER SURGERY Left 2012   capsular repair    Family History  Problem Relation Age of Onset  . Depression Mother   . Anemia Mother   . Heart attack Father   . Hypertension Father   .  Hyperlipidemia Father   . Heart disease Father   . Depression Father   . Glaucoma Father   . Graves' disease Father   . Thyroid disease Father   . Cancer Maternal Grandmother        pancreatic  . Breast cancer Paternal Grandmother   . Leukemia Paternal Grandmother   . Leukemia Paternal Grandfather     Social History   Tobacco Use  . Smoking status: Never Smoker  . Smokeless tobacco: Never Used  Substance Use Topics  . Alcohol use: No  . Drug use: No    Allergies:  Allergies  Allergen Reactions  . Iodine Rash  . Shellfish Allergy Rash  . Latex     Medications Prior to Admission  Medication Sig Dispense Refill Last Dose  . acetaminophen (TYLENOL) 325 MG tablet Take 2 tablets by mouth as needed for pain.     Marland Kitchen levothyroxine (SYNTHROID) 100 MCG tablet Take 1 tablet (100 mcg total) by mouth daily. 90 tablet 2   . loratadine (CLARITIN) 10 MG tablet Take 10 mg by mouth daily as needed for allergies.     Marland Kitchen OVER THE COUNTER  MEDICATION Take 3 each by mouth daily. Prenatal gummies     . SODIUM CHLORIDE PO Take 2 tablets by mouth daily as needed.        Review of Systems  Constitutional: Negative.  Negative for fatigue and fever.  HENT: Negative.   Respiratory: Negative.  Negative for shortness of breath.   Cardiovascular: Negative.  Negative for chest pain.  Gastrointestinal: Positive for abdominal pain. Negative for constipation, diarrhea, nausea and vomiting.  Genitourinary: Negative.  Negative for dysuria, vaginal bleeding and vaginal discharge.  Neurological: Negative.  Negative for dizziness and headaches.   Physical Exam   Blood pressure 107/65, pulse 89, temperature 98.8 F (37.1 C), temperature source Oral, resp. rate 16, last menstrual period 08/10/2018, SpO2 98 %.  Physical Exam  Nursing note and vitals reviewed. Constitutional: She is oriented to person, place, and time. She appears well-developed and well-nourished. No distress.  HENT:  Head: Normocephalic.   Eyes: Pupils are equal, round, and reactive to light.  Cardiovascular: Normal rate, regular rhythm and normal heart sounds.  Respiratory: Effort normal and breath sounds normal. No respiratory distress.  GI: Soft. Bowel sounds are normal. She exhibits no distension. There is no abdominal tenderness.  Neurological: She is alert and oriented to person, place, and time.  Skin: Skin is warm and dry.  Psychiatric: She has a normal mood and affect. Her behavior is normal. Judgment and thought content normal.   Cervix: Closed/thick/ ballotable  Fetal Tracing:  Baseline: 150 Variability: moderate Accels: 10x10 Decels: none  Toco: none   MAU Course  Procedures Results for orders placed or performed during the hospital encounter of 06/04/19 (from the past 24 hour(s))  Urinalysis, Routine w reflex microscopic     Status: Abnormal   Collection Time: 06/04/19  5:20 PM  Result Value Ref Range   Color, Urine YELLOW YELLOW   APPearance HAZY (A) CLEAR   Specific Gravity, Urine 1.009 1.005 - 1.030   pH 8.0 5.0 - 8.0   Glucose, UA 50 (A) NEGATIVE mg/dL   Hgb urine dipstick NEGATIVE NEGATIVE   Bilirubin Urine NEGATIVE NEGATIVE   Ketones, ur NEGATIVE NEGATIVE mg/dL   Protein, ur NEGATIVE NEGATIVE mg/dL   Nitrite NEGATIVE NEGATIVE   Leukocytes,Ua LARGE (A) NEGATIVE   WBC, UA 6-10 0 - 5 WBC/hpf   Bacteria, UA RARE (A) NONE SEEN   Squamous Epithelial / LPF 11-20 0 - 5    MDM Prenatal records from private office not on file. Pregnancy complicated by n/a. Labs ordered and reviewed.  UA, UC NST reactive, patient reports feeling normal fetal movement Unable to do FFN due to exam yesterday in office Cervix closed, no contractions palpated or noted on monitoring. Low suspicion for preterm labor  IV fluid bolus  Patient reports complete resolution of cramping since IV bolus.   Lengthy discussion with patient regarding expected fetal movement patterns at this gestation and upcoming  gestations.  Assessment and Plan   1. Braxton Hick's contraction   2. Decreased fetal movements in second trimester, single or unspecified fetus   3. [redacted] weeks gestation of pregnancy    -Discharge home in stable condition -Preterm labor precautions discussed. Discussed with patient importance of increasing PO hydration -Patient advised to follow-up with [facility] as scheduled for prenatal care -Patient may return to [facility]U as needed or if her condition were to change or worsen   Wende Mott CNM 06/04/2019, 5:38 PM

## 2019-06-06 LAB — CULTURE, OB URINE: Culture: <10000 — AB

## 2019-06-21 DIAGNOSIS — Z362 Encounter for other antenatal screening follow-up: Secondary | ICD-10-CM | POA: Diagnosis not present

## 2019-06-21 DIAGNOSIS — F321 Major depressive disorder, single episode, moderate: Secondary | ICD-10-CM | POA: Diagnosis not present

## 2019-06-27 IMAGING — US US PELVIS COMPLETE TRANSABD/TRANSVAG
1 series · 15 of 25 positions shown · non-contrast
Comparison: None

CLINICAL DATA: Patient with pelvic pain for 1 month. History of
intrauterine device.



[Series 1: us pelvis complete transabd/transvag · 15 of 55 slices shown]
[im 1/55]
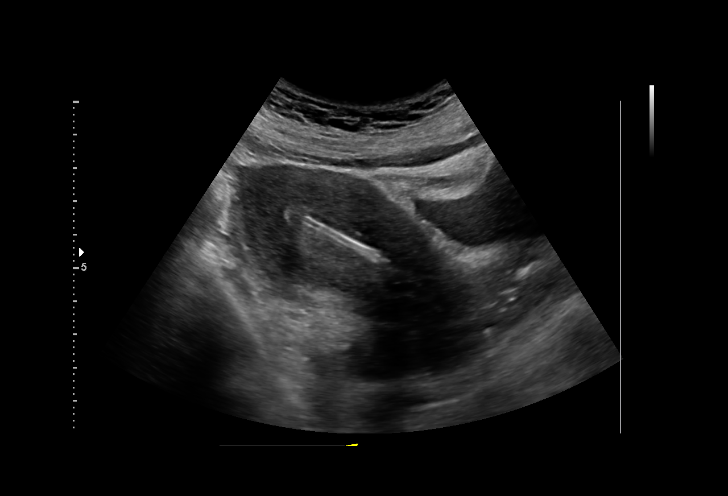
[im 5/55]
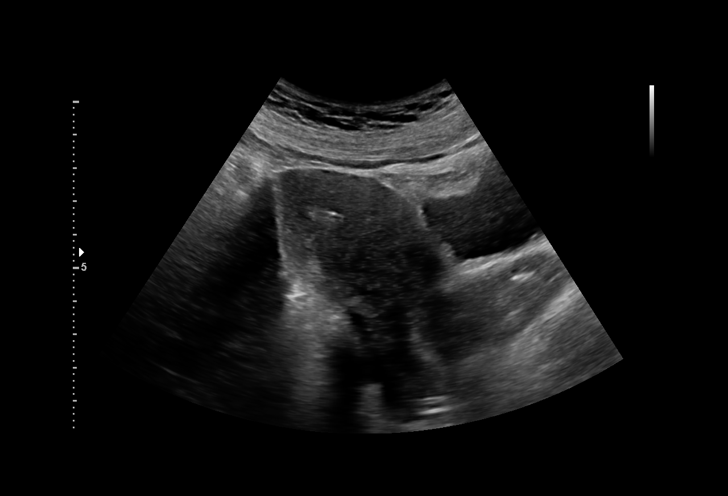
[im 10/55]
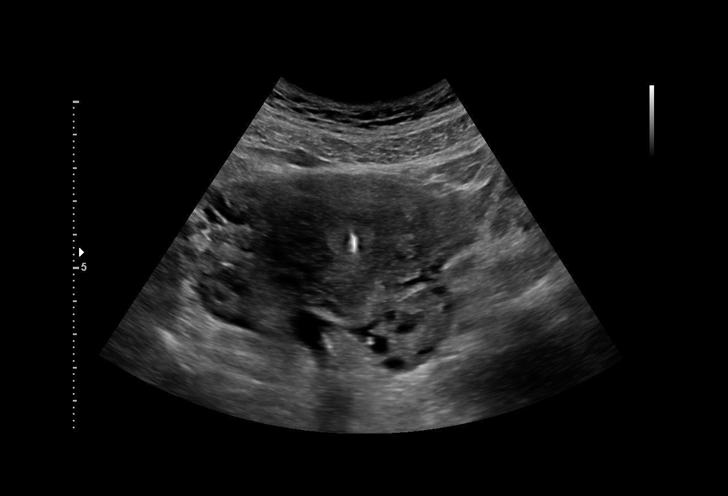
[im 12/55]
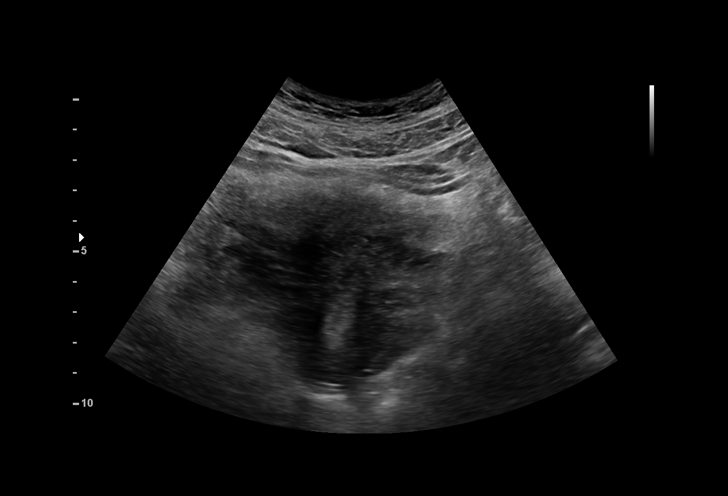
[im 16/55]
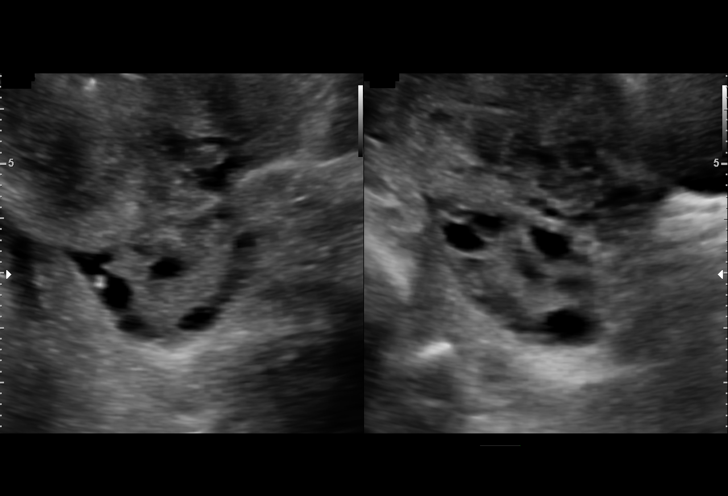
[im 21/55]
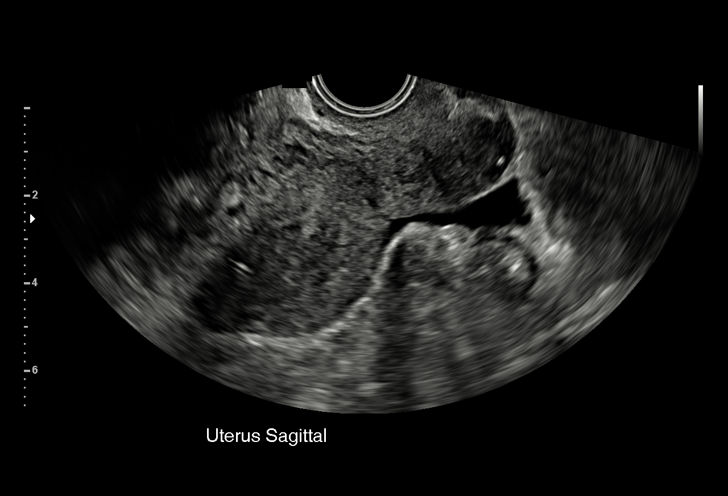
[im 23/55]
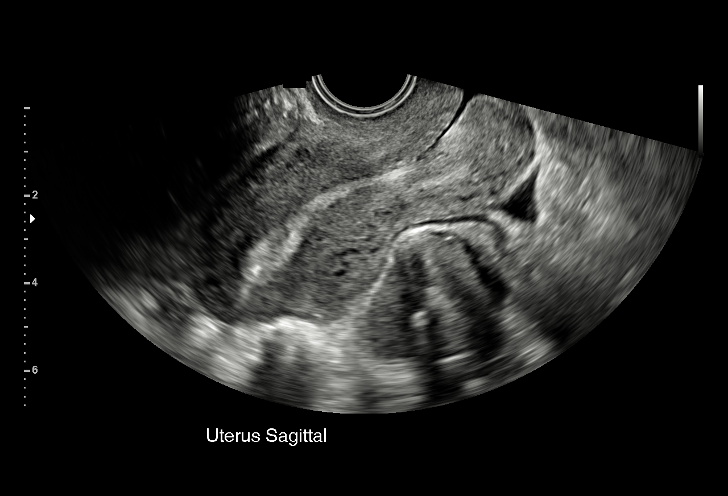
[im 28/55]
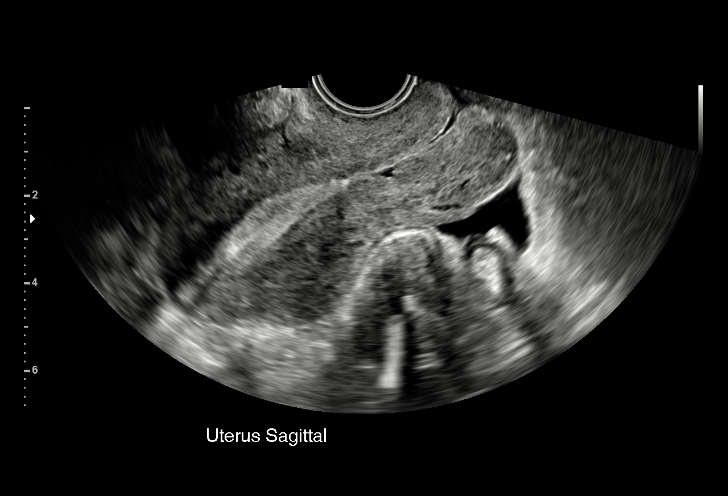
[im 32/55]
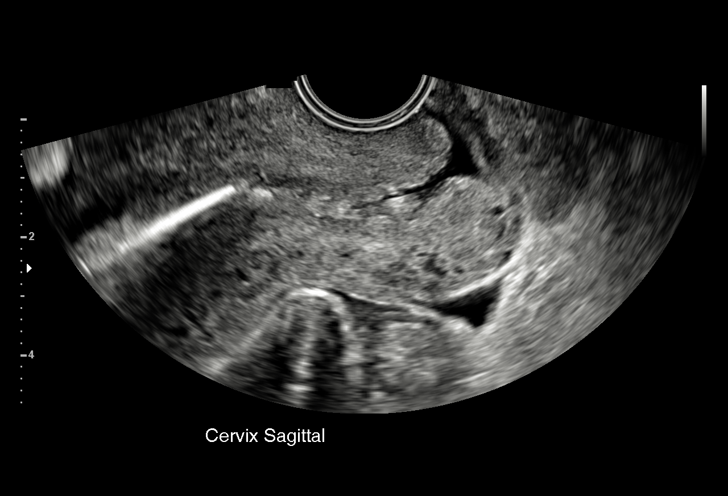
[im 34/55]
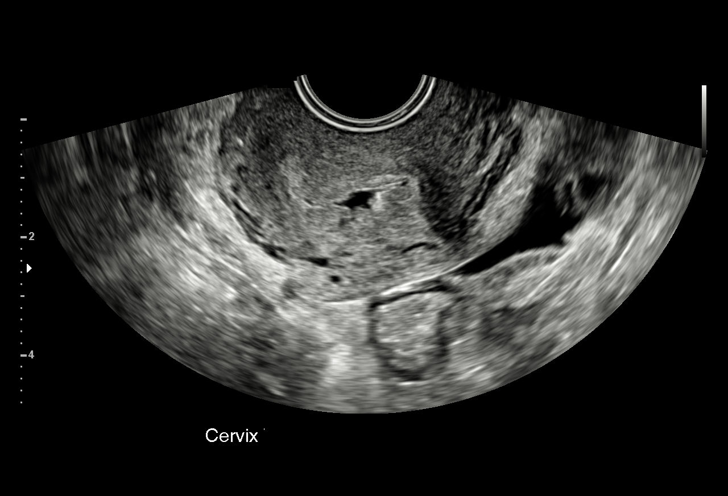
[im 39/55]
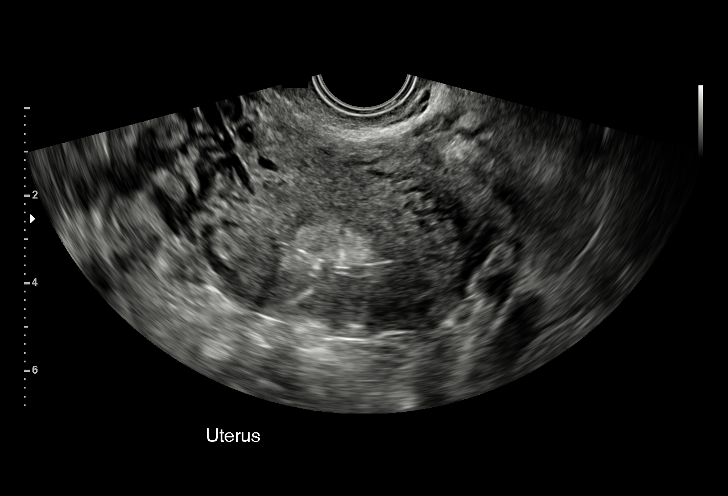
[im 43/55]
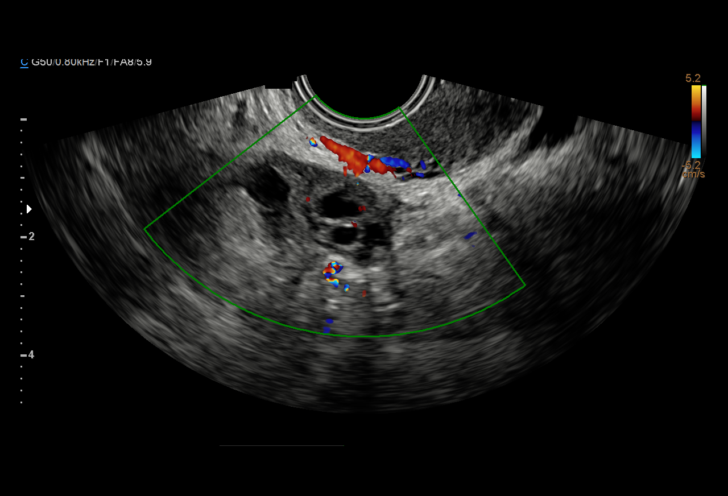
[im 46/55]
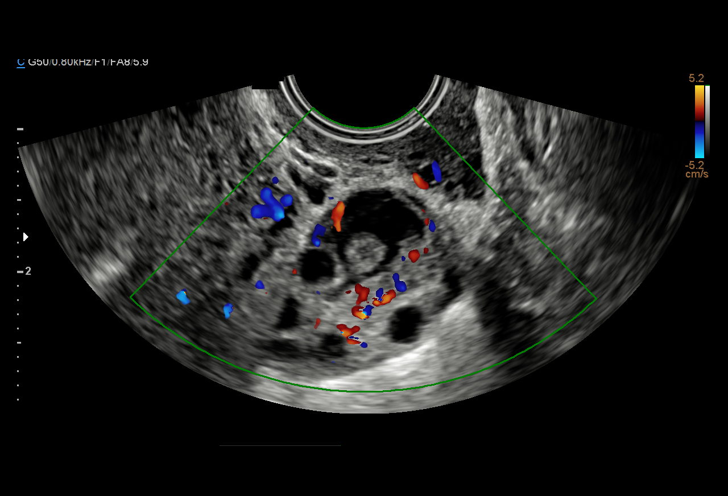
[im 50/55]
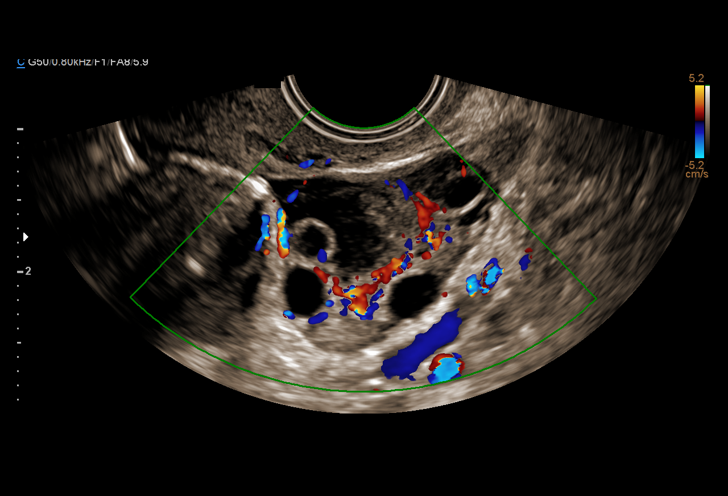
[im 55/55]
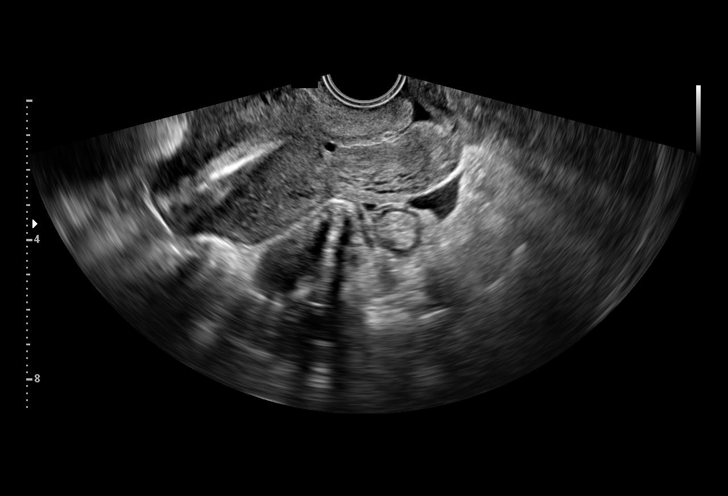

[15 of 25 positions shown; findings below may reference images not displayed]

FINDINGS: Uterus

Measurements: 8.5 x 4.4 x 5.8 cm. No fibroids or other mass
visualized.

Endometrium

Thickness: 10 mm.  Intrauterine device is present

Right ovary

Measurements: 2.9 x 1.6 x 1.7 cm. Normal appearance/no adnexal mass.

Left ovary

Measurements: 3.9 x 2.3 x 2.6 cm. There is a 2.0 x 1.4 x 2.0 cm
complex cystic structure within the left ovary. There is fluid
surrounding the left ovary.

Other findings

Trace free fluid in the pelvis.
IMPRESSION: 1. There is a 2.0 cm complex cystic structure within the left ovary
which may represent corpus luteum or ruptured cyst. Small amount of
left paraovarian fluid. Recommend follow-up pelvic ultrasound in 6-8
weeks to assess resolution.
2. Intrauterine device is present.

## 2019-07-05 DIAGNOSIS — F4322 Adjustment disorder with anxiety: Secondary | ICD-10-CM | POA: Diagnosis not present

## 2019-07-18 DIAGNOSIS — F4322 Adjustment disorder with anxiety: Secondary | ICD-10-CM | POA: Diagnosis not present

## 2019-07-19 ENCOUNTER — Encounter (HOSPITAL_COMMUNITY): Payer: Self-pay | Admitting: Obstetrics and Gynecology

## 2019-07-19 ENCOUNTER — Inpatient Hospital Stay (HOSPITAL_COMMUNITY)
Admission: AD | Admit: 2019-07-19 | Discharge: 2019-07-19 | Disposition: A | Discharge status: Home / Self Care | Payer: BC Managed Care – PPO | Attending: Obstetrics and Gynecology | Admitting: Obstetrics and Gynecology

## 2019-07-19 ENCOUNTER — Other Ambulatory Visit: Payer: Self-pay

## 2019-07-19 DIAGNOSIS — O99613 Diseases of the digestive system complicating pregnancy, third trimester: Secondary | ICD-10-CM

## 2019-07-19 DIAGNOSIS — K529 Noninfective gastroenteritis and colitis, unspecified: Secondary | ICD-10-CM

## 2019-07-19 DIAGNOSIS — Z3689 Encounter for other specified antenatal screening: Secondary | ICD-10-CM

## 2019-07-19 DIAGNOSIS — Z3A31 31 weeks gestation of pregnancy: Secondary | ICD-10-CM

## 2019-07-19 LAB — URINALYSIS, ROUTINE W REFLEX MICROSCOPIC
Bilirubin Urine: NEGATIVE
Glucose, UA: 150 mg/dL — AB
Hgb urine dipstick: NEGATIVE
Ketones, ur: NEGATIVE mg/dL
Nitrite: NEGATIVE
Protein, ur: NEGATIVE mg/dL
Specific Gravity, Urine: 1.002 — ABNORMAL LOW (ref 1.005–1.030)
pH: 7 (ref 5.0–8.0)

## 2019-07-19 LAB — CBC
HCT: 31 % — ABNORMAL LOW (ref 36.0–46.0)
Hemoglobin: 10 g/dL — ABNORMAL LOW (ref 12.0–15.0)
MCH: 27.3 pg (ref 26.0–34.0)
MCHC: 32.3 g/dL (ref 30.0–36.0)
MCV: 84.7 fL (ref 80.0–100.0)
Platelets: 211 10*3/uL (ref 150–400)
RBC: 3.66 MIL/uL — ABNORMAL LOW (ref 3.87–5.11)
RDW: 13.3 % (ref 11.5–15.5)
WBC: 9.1 10*3/uL (ref 4.0–10.5)
nRBC: 0 % (ref 0.0–0.2)

## 2019-07-19 LAB — BASIC METABOLIC PANEL
Anion gap: 8 (ref 5–15)
BUN: <5 mg/dL — ABNORMAL LOW (ref 6–20)
CO2: 22 mmol/L (ref 22–32)
Calcium: 8.7 mg/dL — ABNORMAL LOW (ref 8.9–10.3)
Chloride: 106 mmol/L (ref 98–111)
Creatinine, Ser: 0.49 mg/dL (ref 0.44–1.00)
GFR calc Af Amer: >60 mL/min (ref >60)
GFR calc non Af Amer: >60 mL/min (ref >60)
Glucose, Bld: 109 mg/dL — ABNORMAL HIGH (ref 70–99)
Potassium: 3.4 mmol/L — ABNORMAL LOW (ref 3.5–5.1)
Sodium: 136 mmol/L (ref 135–145)

## 2019-07-19 MED ORDER — ONDANSETRON 4 MG PO TBDP
4.0000 mg | ORAL_TABLET | Freq: Three times a day (TID) | ORAL | 0 refills | Status: DC | PRN
Start: 2019-07-19 — End: 2019-09-02

## 2019-07-19 NOTE — Discharge Instructions (Signed)
Viral Gastroenteritis, Adult  Viral gastroenteritis is also known as the stomach flu. This condition may affect your stomach, your small intestine, and your large intestine. It can cause sudden watery poop (diarrhea), fever, and throwing up (vomiting). This condition is caused by certain germs (viruses). These germs can be passed from person to person very easily (are contagious). Having watery poop and throwing up can make you feel weak and cause you to not have enough water in your body (get dehydrated). This can make you tired and thirsty, make you have a dry mouth, and make it so you pee (urinate) less often. It is important to replace the fluids that you lose from having watery poop and throwing up. What are the causes?  You can get sick by catching viruses from other people.  You can also get sick by: ? Eating food, drinking water, or touching a surface that has the viruses on it (is contaminated). ? Sharing utensils or other personal items with a person who is sick. What increases the risk?  Having a weak body defense system (immune system).  Living with one or more children who are younger than 2 years old.  Living in a nursing home.  Going on cruise ships. What are the signs or symptoms? Symptoms of this condition start suddenly. Symptoms may last for a few days or for as long as a week.  Common symptoms include: ? Watery poop. ? Throwing up.  Other symptoms include: ? Fever. ? Headache. ? Feeling tired (fatigue). ? Pain in the belly (abdomen). ? Chills. ? Feeling weak. ? Feeling sick to your stomach (nauseous). ? Muscle aches. ? Not feeling hungry. How is this treated?  This condition typically goes away on its own.  The focus of treatment is to replace the fluids that you lose. This condition may be treated with: ? An ORS (oral rehydration solution). This is a drink that is sold at pharmacies and stores. ? Medicines to help with your symptoms. ? Probiotic  supplements to reduce symptoms of diarrhea. ? Fluids given through an IV tube, if needed.  Older adults and people with other diseases or a weak body defense system are at higher risk for not having enough water in the body. Follow these instructions at home: Eating and drinking   Take an ORS as told by your doctor.  Drink clear fluids in small amounts as you are able. Clear fluids include: ? Water. ? Ice chips. ? Fruit juice with water added to it (diluted). ? Low-calorie sports drinks.  Drink enough fluid to keep your pee (urine) pale yellow.  Eat small amounts of healthy foods every 3-4 hours as you are able. This may include whole grains, fruits, vegetables, lean meats, and yogurt.  Avoid fluids that have a lot of sugar or caffeine in them, such as energy drinks, sports drinks, and soda.  Avoid spicy or fatty foods.  Avoid alcohol. General instructions   Wash your hands often. This is very important after you have watery poop or you throw up. If you cannot use soap and water, use hand sanitizer.  Make sure that all people in your home wash their hands well and often.  Take over-the-counter and prescription medicines only as told by your doctor.  Rest at home while you get better.  Watch your condition for any changes.  Take a warm bath to help with any burning or pain from having watery poop.  Keep all follow-up visits as told by your doctor.   This is important. Contact a doctor if:  You cannot keep fluids down.  Your symptoms get worse.  You have new symptoms.  You feel light-headed.  You feel dizzy.  You have muscle cramps. Get help right away if:  You have chest pain.  You feel very weak.  You pass out (faint).  You see blood in your throw-up.  Your throw-up looks like coffee grounds.  You have bloody or black poop (stools) or poop that looks like tar.  You have a very bad headache, or a stiff neck, or both.  You have a rash.  You have  very bad pain, cramping, or bloating in your belly.  You have trouble breathing.  You are breathing very quickly.  You have a fast heartbeat.  Your skin feels cold and clammy.  You feel mixed up (confused).  You have pain when you pee.  You have signs of not having enough water in the body, such as: ? Dark pee, hardly any pee, or no pee. ? Cracked lips. ? Dry mouth. ? Sunken eyes. ? Feeling very sleepy. ? Feeling weak. Summary  Viral gastroenteritis is also known as the stomach flu.  This condition can cause sudden watery poop (diarrhea), fever, and throwing up (vomiting).  These germs can be passed from person to person very easily.  Take an ORS as told by your doctor. This is a drink that is sold at pharmacies and stores.  Drink fluids in small amounts many times each day as you are able. This information is not intended to replace advice given to you by your health care provider. Make sure you discuss any questions you have with your health care provider. Document Revised: 12/16/2017 Document Reviewed: 12/16/2017 Elsevier Patient Education  2020 [employer].   Food Choices to Help Relieve Diarrhea, Adult When you have diarrhea, the foods you eat and your eating habits are very important. Choosing the right foods and drinks can help:  Relieve diarrhea.  Replace lost fluids and nutrients.  Prevent dehydration. What general guidelines should I follow?  Relieving diarrhea  Choose foods with less than 2 g or .07 oz. of fiber per serving.  Limit fats to less than 8 tsp (38 g or 1.34 oz.) a day.  Avoid the following: ? Foods and beverages sweetened with high-fructose corn syrup, honey, or sugar alcohols such as xylitol, sorbitol, and mannitol. ? Foods that contain a lot of fat or sugar. ? Fried, greasy, or spicy foods. ? High-fiber grains, breads, and cereals. ? Raw fruits and vegetables.  Eat foods that are rich in probiotics. These foods include dairy  products such as yogurt and fermented milk products. They help increase healthy bacteria in the stomach and intestines (gastrointestinal tract, or GI tract).  If you have lactose intolerance, avoid dairy products. These may make your diarrhea worse.  Take medicine to help stop diarrhea (antidiarrheal medicine) only as told by your health care provider. Replacing nutrients  Eat small meals or snacks every 3-4 hours.  Eat bland foods, such as white rice, toast, or baked potato, until your diarrhea starts to get better. Gradually reintroduce nutrient-rich foods as tolerated or as told by your health care provider. This includes: ? Well-cooked protein foods. ? Peeled, seeded, and soft-cooked fruits and vegetables. ? Low-fat dairy products.  Take vitamin and mineral supplements as told by your health care provider. Preventing dehydration  Start by sipping water or a special solution to prevent dehydration (oral rehydration solution, ORS). Urine that  is clear or pale yellow means that you are getting enough fluid.  Try to drink at least 8-10 cups of fluid each day to help replace lost fluids.  You may add other liquids in addition to water, such as clear juice or decaffeinated sports drinks, as tolerated or as told by your health care provider.  Avoid drinks with caffeine, such as coffee, tea, or soft drinks.  Avoid alcohol. What foods are recommended?     The items listed may not be a complete list. Talk with your health care provider about what dietary choices are best for you. Grains White rice. White, [place], or pita breads (fresh or toasted), including plain rolls, buns, or bagels. White pasta. Saltine, soda, or graham crackers. Pretzels. Low-fiber cereal. Cooked cereals made with water (such as cornmeal, farina, or cream cereals). Plain muffins. Matzo. Melba toast. Zwieback. Vegetables Potatoes (without the skin). Most well-cooked and canned vegetables without skins or seeds. Tender  lettuce. Fruits Apple sauce. Fruits canned in juice. Cooked apricots, cherries, grapefruit, peaches, pears, or plums. Fresh bananas and cantaloupe. Meats and other protein foods Baked or boiled chicken. Eggs. Tofu. Fish. Seafood. Smooth nut butters. Ground or well-cooked tender beef, ham, veal, lamb, pork, or poultry. Dairy Plain yogurt, kefir, and unsweetened liquid yogurt. Lactose-free milk, buttermilk, skim milk, or soy milk. Low-fat or nonfat hard cheese. Beverages Water. Low-calorie sports drinks. Fruit juices without pulp. Strained tomato and vegetable juices. Decaffeinated teas. Sugar-free beverages not sweetened with sugar alcohols. Oral rehydration solutions, if approved by your health care provider. Seasoning and other foods Bouillon, broth, or soups made from recommended foods. What foods are not recommended? The items listed may not be a complete list. Talk with your health care provider about what dietary choices are best for you. Grains Whole grain, whole wheat, bran, or rye breads, rolls, pastas, and crackers. Wild or brown rice. Whole grain or bran cereals. Barley. Oats and oatmeal. Corn tortillas or taco shells. Granola. Popcorn. Vegetables Raw vegetables. Fried vegetables. Cabbage, broccoli, Brussels sprouts, artichokes, baked beans, beet greens, corn, kale, legumes, peas, sweet potatoes, and yams. Potato skins. Cooked spinach and cabbage. Fruits Dried fruit, including raisins and dates. Raw fruits. Stewed or dried prunes. Canned fruits with syrup. Meat and other protein foods Fried or fatty meats. Deli meats. Chunky nut butters. Nuts and seeds. Beans and lentils. Tomasa Blase. Hot dogs. Sausage. Dairy High-fat cheeses. Whole milk, chocolate milk, and beverages made with milk, such as milk shakes. Half-and-half. Cream. sour cream. Ice cream. Beverages Caffeinated beverages (such as coffee, tea, soda, or energy drinks). Alcoholic beverages. Fruit juices with pulp. Prune juice. Soft  drinks sweetened with high-fructose corn syrup or sugar alcohols. High-calorie sports drinks. Fats and oils Butter. Cream sauces. Margarine. Salad oils. Plain salad dressings. Olives. Avocados. Mayonnaise. Sweets and desserts Sweet rolls, doughnuts, and sweet breads. Sugar-free desserts sweetened with sugar alcohols such as xylitol and sorbitol. Seasoning and other foods Honey. Hot sauce. Chili powder. Gravy. Cream-based or milk-based soups. Pancakes and waffles. Summary  When you have diarrhea, the foods you eat and your eating habits are very important.  Make sure you get at least 8-10 cups of fluid each day, or enough to keep your urine clear or pale yellow.  Eat bland foods and gradually reintroduce healthy, nutrient-rich foods as tolerated, or as told by your health care provider.  Avoid high-fiber, fried, greasy, or spicy foods. This information is not intended to replace advice given to you by your health care provider. Make sure  you discuss any questions you have with your health care provider. Document Revised: 06/03/2018 Document Reviewed: 02/08/2016 Elsevier Patient Education  2020 [employer].

## 2019-07-19 NOTE — MAU Provider Note (Signed)
History     CSN: [id number]  Arrival date and time: 07/19/19 1618   First Provider Initiated Contact with Patient 07/19/19 1658      Chief Complaint  Patient presents with  . Contractions   29 y.o. L2G4010 @31 .6 wks presenting with diarrhea and abd cramping. Sx started yesterday. Diarrhea is watery, no blood. Had 4 episodes yesterday and 2 today. Has not tried any OTC. Her child has had diarrhea x5 days. Denies fever. Some nausea but no vomiting. Denies VB or LOF. Has BH ctx for "a while". Report + FM but feels consistency is different today.   OB History    Gravida  3   Para  1   Term  1   Preterm      AB  1   Living  1     SAB  1   TAB      Ectopic      Multiple  0   Live Births  1           Past Medical History:  Diagnosis Date  . History of febrile seizure    INFANT  . History of hyperthyroidism    DX 2015--  TSH LEVELS NORMALIZED WITHOUT ANY TREATMENT  . History of hypotension   . History of ovarian cyst   . 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 Iron deficiency anemia   . Pilonidal cyst with abscess   . Wears glasses     Past Surgical History:  Procedure Laterality Date  . PILONIDAL CYST EXCISION N/A 06/17/2017   Procedure: CYST EXCISION PILONIDAL;  Surgeon: 06/19/2017, Sheliah Hatch, MD;  Location: [facility];  Service: General;  Laterality: N/A;  . SHOULDER SURGERY Left 2012   capsular repair    Family History  Problem Relation Age of Onset  . Depression Mother   . Anemia Mother   . Heart attack Father   . Hypertension Father   . Hyperlipidemia Father   . Heart disease Father   . Depression Father   . Glaucoma Father   . Graves' disease Father   . Thyroid disease Father   . Cancer Maternal Grandmother        pancreatic  . Breast cancer Paternal Grandmother   . Leukemia Paternal Grandmother   . Leukemia Paternal Grandfather     Social History   Tobacco  Use  . Smoking status: Never Smoker  . Smokeless tobacco: Never Used  Substance Use Topics  . Alcohol use: No  . Drug use: No    Allergies:  Allergies  Allergen Reactions  . Iodine Rash  . Shellfish Allergy Rash  . Latex     No medications prior to admission.    Review of Systems  Constitutional: Negative for chills and fever.  Gastrointestinal: Positive for abdominal pain, diarrhea and nausea. Negative for vomiting.  Genitourinary: Negative for vaginal bleeding and vaginal discharge.   Physical Exam   Blood pressure 122/69, pulse 86, temperature 97.8 F (36.6 C), resp. rate 16, height 5\' 4"  (1.626 m), weight 79.8 kg, last menstrual period 08/10/2018, SpO2 100 %.  Physical Exam  Nursing note and vitals reviewed. Constitutional: She is oriented to person, place, and time. She appears well-developed and well-nourished. No distress.  HENT:  Head: Normocephalic and atraumatic.  Cardiovascular: Normal rate.  Respiratory: Effort normal.  GI: Soft. She exhibits no distension. There is no abdominal tenderness.  gravid  Genitourinary:    Genitourinary  Comments: VE: closed/long   Musculoskeletal:        General: Normal range of motion.     Cervical back: Normal range of motion.  Neurological: She is alert and oriented to person, place, and time.  Skin: Skin is warm and dry.  Psychiatric: Her mood appears anxious.  EFM: 135 bpm, mod variability, + accels, no decels Toco: none  Results for orders placed or performed during the hospital encounter of 07/19/19 (from the past 24 hour(s))  CBC     Status: Abnormal   Collection Time: 07/19/19  5:26 PM  Result Value Ref Range   WBC 9.1 4.0 - 10.5 K/uL   RBC 3.66 (L) 3.87 - 5.11 MIL/uL   Hemoglobin 10.0 (L) 12.0 - 15.0 g/dL   HCT 85.4 (L) 62.7 - 03.5 %   MCV 84.7 80.0 - 100.0 fL   MCH 27.3 26.0 - 34.0 pg   MCHC 32.3 30.0 - 36.0 g/dL   RDW 00.9 38.1 - 82.9 %   Platelets 211 150 - 400 K/uL   nRBC 0.0 0.0 - 0.2 %  Basic  metabolic panel     Status: Abnormal   Collection Time: 07/19/19  5:26 PM  Result Value Ref Range   Sodium 136 135 - 145 mmol/L   Potassium 3.4 (L) 3.5 - 5.1 mmol/L   Chloride 106 98 - 111 mmol/L   CO2 22 22 - 32 mmol/L   Glucose, Bld 109 (H) 70 - 99 mg/dL   BUN <5 (L) 6 - 20 mg/dL   Creatinine, Ser 9.37 0.44 - 1.00 mg/dL   Calcium 8.7 (L) 8.9 - 10.3 mg/dL   GFR calc non Af Amer >60 >60 mL/min   GFR calc Af Amer >60 >60 mL/min   Anion gap 8 5 - 15  Urinalysis, Routine w reflex microscopic     Status: Abnormal   Collection Time: 07/19/19  5:55 PM  Result Value Ref Range   Color, Urine STRAW (A) YELLOW   APPearance CLEAR CLEAR   Specific Gravity, Urine 1.002 (L) 1.005 - 1.030   pH 7.0 5.0 - 8.0   Glucose, UA 150 (A) NEGATIVE mg/dL   Hgb urine dipstick NEGATIVE NEGATIVE   Bilirubin Urine NEGATIVE NEGATIVE   Ketones, ur NEGATIVE NEGATIVE mg/dL   Protein, ur NEGATIVE NEGATIVE mg/dL   Nitrite NEGATIVE NEGATIVE   Leukocytes,Ua LARGE (A) NEGATIVE   RBC / HPF 0-5 0 - 5 RBC/hpf   WBC, UA 6-10 0 - 5 WBC/hpf   Bacteria, UA RARE (A) NONE SEEN   Squamous Epithelial / LPF 0-5 0 - 5   MAU Course  Procedures  MDM Labs ordered and reviewed. No signs of PTL. No signs of dehydration. Likely viral GI, usually self limiting. Discussed supportive measures. F/u with PCP if prolonged duration. Zofran prn. Imodium OTC. Stable for discharge home.   Assessment and Plan   1. [redacted] weeks gestation of pregnancy   2. NST (non-stress test) reactive   3. Gastroenteritis    Discharge home Follow up at [facility] as scheduled Rx Zofran BRAT diet Maintain hydration  Allergies as of 07/19/2019      Reactions   Iodine Rash   Shellfish Allergy Rash   Latex       Medication List    STOP taking these medications   SODIUM CHLORIDE PO     TAKE these medications   FLUoxetine 10 MG tablet Commonly known as: PROZAC Take 10 mg by mouth daily.   levothyroxine 100 MCG  tablet Commonly known as:  SYNTHROID Take 1 tablet (100 mcg total) by mouth daily.   loratadine 10 MG tablet Commonly known as: CLARITIN Take 10 mg by mouth daily as needed for allergies.   ondansetron 4 MG disintegrating tablet Commonly known as: Zofran ODT Take 1 tablet (4 mg total) by mouth every 8 (eight) hours as needed for nausea or vomiting.   OVER THE COUNTER MEDICATION Take 3 each by mouth daily. Prenatal gummies   Tylenol 325 MG tablet Generic drug: acetaminophen Take 2 tablets by mouth as needed for pain.      Julianne Handler, CNM 07/19/2019, 7:01 PM

## 2019-07-19 NOTE — MAU Note (Signed)
.   Laura Baxter is a 29 y.o. at [redacted]w[redacted]d here in MAU reporting: 2 episodes of diarrhea today. States her child had a stomach bug recently. Reports lower abdominal cramping and says that her baby is moving "DIFFERENTLY" Denies any VB or LOF  Onset of complaint: 2 days Pain score: 4 Vitals:   07/19/19 1643  BP: 128/70  Pulse: 84  Resp: 18  Temp: 97.8 F (36.6 C)  SpO2: 100%     FHT:135 Lab orders placed from triage: UA

## 2019-07-20 DIAGNOSIS — Z23 Encounter for immunization: Secondary | ICD-10-CM | POA: Diagnosis not present

## 2019-07-21 LAB — CULTURE, OB URINE

## 2019-07-25 ENCOUNTER — Observation Stay (HOSPITAL_COMMUNITY)
Admission: AD | Admit: 2019-07-25 | Discharge: 2019-07-26 | Disposition: A | Discharge status: Home / Self Care | Payer: BC Managed Care – PPO | Attending: Obstetrics & Gynecology | Admitting: Obstetrics & Gynecology

## 2019-07-25 ENCOUNTER — Encounter (HOSPITAL_COMMUNITY): Payer: Self-pay | Admitting: Obstetrics & Gynecology

## 2019-07-25 ENCOUNTER — Other Ambulatory Visit: Payer: Self-pay

## 2019-07-25 DIAGNOSIS — O99283 Endocrine, nutritional and metabolic diseases complicating pregnancy, third trimester: Secondary | ICD-10-CM | POA: Diagnosis not present

## 2019-07-25 DIAGNOSIS — Z3689 Encounter for other specified antenatal screening: Secondary | ICD-10-CM

## 2019-07-25 DIAGNOSIS — O99013 Anemia complicating pregnancy, third trimester: Secondary | ICD-10-CM | POA: Insufficient documentation

## 2019-07-25 DIAGNOSIS — D509 Iron deficiency anemia, unspecified: Secondary | ICD-10-CM | POA: Insufficient documentation

## 2019-07-25 DIAGNOSIS — Z79890 Hormone replacement therapy (postmenopausal): Secondary | ICD-10-CM | POA: Diagnosis not present

## 2019-07-25 DIAGNOSIS — Z79899 Other long term (current) drug therapy: Secondary | ICD-10-CM | POA: Insufficient documentation

## 2019-07-25 DIAGNOSIS — E063 Autoimmune thyroiditis: Secondary | ICD-10-CM | POA: Insufficient documentation

## 2019-07-25 DIAGNOSIS — O4703 False labor before 37 completed weeks of gestation, third trimester: Secondary | ICD-10-CM | POA: Diagnosis not present

## 2019-07-25 DIAGNOSIS — Z20822 Contact with and (suspected) exposure to covid-19: Secondary | ICD-10-CM | POA: Insufficient documentation

## 2019-07-25 DIAGNOSIS — Z3A32 32 weeks gestation of pregnancy: Secondary | ICD-10-CM | POA: Diagnosis not present

## 2019-07-25 DIAGNOSIS — O4700 False labor before 37 completed weeks of gestation, unspecified trimester: Secondary | ICD-10-CM

## 2019-07-25 LAB — URINALYSIS, ROUTINE W REFLEX MICROSCOPIC
Bilirubin Urine: NEGATIVE
Glucose, UA: NEGATIVE mg/dL
Hgb urine dipstick: NEGATIVE
Ketones, ur: NEGATIVE mg/dL
Nitrite: NEGATIVE
Protein, ur: NEGATIVE mg/dL
Specific Gravity, Urine: 1.002 — ABNORMAL LOW (ref 1.005–1.030)
pH: 7 (ref 5.0–8.0)

## 2019-07-25 LAB — ABO/RH: ABO/RH(D): B POS

## 2019-07-25 LAB — TYPE AND SCREEN
ABO/RH(D): B POS
Antibody Screen: NEGATIVE

## 2019-07-25 LAB — GROUP B STREP BY PCR: Group B strep by PCR: NEGATIVE

## 2019-07-25 LAB — FETAL FIBRONECTIN: Fetal Fibronectin: NEGATIVE

## 2019-07-25 LAB — SARS CORONAVIRUS 2 BY RT PCR (HOSPITAL ORDER, PERFORMED IN ~~LOC~~ HOSPITAL LAB): SARS Coronavirus 2: NEGATIVE

## 2019-07-25 MED ORDER — NIFEDIPINE 10 MG PO CAPS
20.0000 mg | ORAL_CAPSULE | Freq: Once | ORAL | Status: AC
  Administered 2019-07-25: 20 mg via ORAL
  Filled 2019-07-25: qty 2

## 2019-07-25 MED ORDER — DOCUSATE SODIUM 100 MG PO CAPS
100.0000 mg | ORAL_CAPSULE | Freq: Every day | ORAL | Status: DC
  Filled 2019-07-25: qty 1

## 2019-07-25 MED ORDER — BETAMETHASONE SOD PHOS & ACET 6 (3-3) MG/ML IJ SUSP
12.0000 mg | INTRAMUSCULAR | Status: AC
  Administered 2019-07-25 – 2019-07-26 (×2): 12 mg via INTRAMUSCULAR
  Filled 2019-07-25: qty 5

## 2019-07-25 MED ORDER — ACETAMINOPHEN 325 MG PO TABS
650.0000 mg | ORAL_TABLET | ORAL | Status: DC | PRN
  Administered 2019-07-25 (×2): 650 mg via ORAL
  Filled 2019-07-25 (×2): qty 2

## 2019-07-25 MED ORDER — PRENATAL MULTIVITAMIN CH
1.0000 | ORAL_TABLET | Freq: Every day | ORAL | Status: DC
  Administered 2019-07-26: 1 via ORAL
  Filled 2019-07-25: qty 1

## 2019-07-25 MED ORDER — LACTATED RINGERS IV SOLN: INTRAVENOUS | Status: DC

## 2019-07-25 MED ORDER — NIFEDIPINE 10 MG PO CAPS
10.0000 mg | ORAL_CAPSULE | ORAL | Status: DC | PRN
  Administered 2019-07-26 (×2): 10 mg via ORAL
  Filled 2019-07-25 (×2): qty 1

## 2019-07-25 MED ORDER — LACTATED RINGERS IV BOLUS
1000.0000 mL | Freq: Once | INTRAVENOUS | Status: AC
  Administered 2019-07-25: 1000 mL via INTRAVENOUS

## 2019-07-25 MED ORDER — CALCIUM CARBONATE ANTACID 500 MG PO CHEW
2.0000 | CHEWABLE_TABLET | ORAL | Status: DC | PRN
  Administered 2019-07-26: 400 mg via ORAL
  Filled 2019-07-25: qty 2

## 2019-07-25 NOTE — H&P (Signed)
OB ADMISSION/ HISTORY & PHYSICAL:  Admission Date: 07/25/2019  8:45 AM  Admit Diagnosis: Preterm contractions  Otilio Miu is a 29 y.o. female G72P1011 [redacted]w[redacted]d presenting for abd pain that began @ 1900 on 07/24/19. Pain treated and relieved overnight w/ Tylenol and Benadryl, worsened this morning and accompanied by a burning sensation. Pt was seen and treated in MAU w/ IV fluids, fFN was negative, and cervix was initially closed. Repeat SVE shows preterm dilation to 2/TH/Ballotable. MAU provider notified Dr. Drema Balzarine of need for extended monitoring and observation. Currently, pt endorses active FM, denies LOF and vaginal bleeding. Ctx has decreased since Procardia 20 mg PO. Plan of care discussed to administer antenatal steroids per protocol, Procardia PRN ctx, and continue tocometry.  History of current pregnancy: G3P1011   Primary Ob Provider: [facility] Patient entered care with [facility] at 12+6 wks.   EDC of 09/14/19 was established by certain LMP and congruent w/ anat U/S.   Anatomy scan:  21+1 wks, with normal findings and posterior placenta.   Antenatal testing: N/A  Significant hx: Shoulder dystocia @ 41+3 for 7# 8 oz baby, plans IOL @ 39 wks Patient Active Problem List   Diagnosis Date Noted  . Preterm uterine contractions in third trimester, antepartum 07/25/2019  . Hypothyroidism 02/10/2017  . Obstetric vaginal laceration 08/22/2016  . 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 Family history of cleft lip and palate 08/17/2016  . Shellfish allergy 08/17/2016  . 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 [place], [place] [address]  [phone]) Antibody: NEG (05/31 1041) Rubella:   immune RPR:   NR HBsAg:   negative HIV:   NR GTT: passed 1 hr GBS: NEGATIVE/-- (05/31 1407)  GC/CHL: neg/neg Genetics: low-risk, declined AFP    OB History  Gravida Para Term Preterm AB Living  3 1 1   1 1   SAB TAB Ectopic  Multiple Live Births  1     0 1    # Outcome Date GA Lbr Len/2nd Weight Sex Delivery Anes PTL Lv  3 Current           2 Term 08/22/16 [redacted]w[redacted]d 09:43 / 00:46 3410 g F Vag-Spont Local  LIV  1 SAB             Medical / Surgical History: Past medical history:  Past Medical History:  Diagnosis Date  . History of febrile seizure    INFANT  . History of hyperthyroidism    DX 2015--  TSH LEVELS NORMALIZED WITHOUT ANY TREATMENT  . History of hypotension   . History of ovarian cyst   . 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 Iron deficiency anemia   . Pilonidal cyst with abscess   . Wears glasses     Past surgical history:  Past Surgical History:  Procedure Laterality Date  . PILONIDAL CYST EXCISION N/A 06/17/2017   Procedure: CYST EXCISION PILONIDAL;  Surgeon: 06/19/2017, Sheliah Hatch, MD;  Location: [facility];  Service: General;  Laterality: N/A;  . SHOULDER SURGERY Left 2012   capsular repair   Family History:  Family History  Problem Relation Age of Onset  . Depression Mother   . Anemia Mother   . Heart attack Father   . Hypertension Father   . Hyperlipidemia Father   . Heart disease Father   . Depression Father   .  Glaucoma Father   . Graves' disease Father   . Thyroid disease Father   . Cancer Maternal Grandmother        pancreatic  . Breast cancer Paternal Grandmother   . Leukemia Paternal Grandmother   . Leukemia Paternal Grandfather     Social History:  reports that she has never smoked. She has never used smokeless tobacco. She reports that she does not drink alcohol or use drugs.  Allergies: Iodine, Shellfish allergy, and Latex   Current Medications at time of admission:  Prior to Admission medications   Medication Sig Start Date End Date Taking? Authorizing Provider  acetaminophen (TYLENOL) 325 MG tablet Take 2 tablets by mouth as needed for pain.   Yes [provider]   Docosahexaenoic Acid (DHA COMPLETE PO) Take by mouth.   Yes [provider]  ferrous sulfate 325 (65 FE) MG tablet Take 325 mg by mouth daily with breakfast.   Yes [provider]  FLUoxetine (PROZAC) 10 MG tablet Take 10 mg by mouth daily.   Yes [provider]  levothyroxine (SYNTHROID) 100 MCG tablet Take 1 tablet (100 mcg total) by mouth daily. 02/05/19  Yes Renato Shin, MD  loratadine (CLARITIN) 10 MG tablet Take 10 mg by mouth daily as needed for allergies.   Yes [provider]  OVER THE COUNTER MEDICATION Take 3 each by mouth daily. Prenatal gummies   Yes [provider]  ondansetron (ZOFRAN ODT) 4 MG disintegrating tablet Take 1 tablet (4 mg total) by mouth every 8 (eight) hours as needed for nausea or vomiting. 07/19/19   Julianne Handler, CNM    Review of Systems: Constitutional: Negative   HENT: Negative   Eyes: Negative   Respiratory: Negative   Cardiovascular: Negative   Gastrointestinal: Negative  Genitourinary: neg for bloody show, neg for LOF   Musculoskeletal: Negative   Skin: Negative   Neurological: Negative   Endo/Heme/Allergies: Negative   Psychiatric/Behavioral: Negative    Physical Exam: VS: Blood pressure 114/61, pulse 72, temperature 98.4 F (36.9 C), temperature source Oral, resp. rate 16, height 5\' 4"  (1.626 m), weight 79.4 kg, last menstrual period 08/10/2018, SpO2 100 %. AAO x3, no signs of distress Cardiovascular: RRR Respiratory: Lung fields clear to ausculation GU/GI: Abdomen gravid, non-tender, non-distended, active FM, vertex per Leopold's Extremities: no edema, negative for pain, tenderness, and cords  Cervical exam:Dilation: 1 Effacement (%): soft, multiparous Station: Ballotable Exam by:: Clois Dupes, CNM FHR: baseline rate 145 / variability moderate / accelerations present / absent decelerations TOCO: occasional ctx   Prenatal Transfer Tool  Maternal Diabetes: No Genetic Screening:  Normal Maternal Ultrasounds/Referrals: Normal Fetal Ultrasounds or other Referrals:  None Maternal Substance Abuse:  No Significant Maternal Medications:  Meds include: Syntroid Significant Maternal Lab Results: Group B Strep negative    Assessment/Plan: 29 y.o. G3P1011 [redacted]w[redacted]d Preterm contractions Cat 1  Admit to OB Specialty for 23 hr OBS Procardia 20 mg PO now Procardia 10 mg PO Q 4 hrs PRN contraction Antenatal steroids GBS PCR GC/Chlam Reg diet Repeat SVE prior to next dose of Procardia   Plan reviewed w/ Dr Alesia Richards    Arrie Eastern MSN, CNM 07/25/2019 6:08 PM

## 2019-07-25 NOTE — MAU Note (Signed)
Been having cramping since about 7 last night, feels like a rolling cramp, lower abd to back.  When started last night - had a migraine and was seeing spots, those have stopped.

## 2019-07-25 NOTE — MAU Provider Note (Signed)
History     CSN: [id number]  Arrival date and time: 07/25/19 6789   First Provider Initiated Contact with Patient 07/25/19 [phone]      Chief Complaint  Patient presents with  . Abdominal Pain   Laura Baxter is a 29 y.o. G3P1011 at [redacted]w[redacted]d who presents to MAU for PTL evaluation after cramping began at 7pm last night. Patient describes cramping as a "rolling sensation" where cramping starts in the front and wraps around to the back. Patient also endorses low back pain that feels different than usual. Patient reports it usually feels like squeezing or tightness, but today endorses a burning sensation. Patient denies feeling abdominal pain or a tightening/hardening sensation in her abdomen. Patient attempted to take a warm bath last night which helped with her lower back pain and Tylenol and Benadryl, which did not work. Patient rates cramping as 5/10 at this time.  Pt denies ctx, change in vaginal discharge amount/color/consistency, VB, intermittent abdominal discomfort/pain, pelvic pressure/pain. Pt denies chest pain and SOB.  Pt denies constipation, diarrhea, or urinary problems. Pt denies fever, chills, fatigue, sweating or changes in appetite. Pt denies dizziness, light-headedness, weakness.  Pt denies VB, LOF and reports good FM.  Current pregnancy problems? Hx shoulder dystocia with AGA Blood Type? B Positive Allergies? Iodine, shellfish, latex powder Current medications? Levothyroxine, fluoxetine, iron, PNVs Current PNC & next appt? [facility], week of June 10   OB History    Gravida  3   Para  1   Term  1   Preterm      AB  1   Living  1     SAB  1   TAB      Ectopic      Multiple  0   Live Births  1           Past Medical History:  Diagnosis Date  . History of febrile seizure    INFANT  . History of hyperthyroidism    DX 2015--  TSH LEVELS NORMALIZED WITHOUT ANY TREATMENT  . History of hypotension   . History of ovarian cyst   . Hypothyroidism,  secondary ENDOCRINOLOGIST-  DR Loanne Drilling   SECONDARY TO LYMPHOCYTIC THYROIDITIS  . Iron deficiency anemia   . Pilonidal cyst with abscess   . Wears glasses     Past Surgical History:  Procedure Laterality Date  . PILONIDAL CYST EXCISION N/A 06/17/2017   Procedure: CYST EXCISION PILONIDAL;  Surgeon: Kieth Brightly, Arta Bruce, MD;  Location: [facility];  Service: General;  Laterality: N/A;  . SHOULDER SURGERY Left 2012   capsular repair    Family History  Problem Relation Age of Onset  . Depression Mother   . Anemia Mother   . Heart attack Father   . Hypertension Father   . Hyperlipidemia Father   . Heart disease Father   . Depression Father   . Glaucoma Father   . Graves' disease Father   . Thyroid disease Father   . Cancer Maternal Grandmother        pancreatic  . Breast cancer Paternal Grandmother   . Leukemia Paternal Grandmother   . Leukemia Paternal Grandfather     Social History   Tobacco Use  . Smoking status: Never Smoker  . Smokeless tobacco: Never Used  Substance Use Topics  . Alcohol use: No  . Drug use: No    Allergies:  Allergies  Allergen Reactions  . Iodine Rash  . Shellfish Allergy Rash  . Latex  Medications Prior to Admission  Medication Sig Dispense Refill Last Dose  . acetaminophen (TYLENOL) 325 MG tablet Take 2 tablets by mouth as needed for pain.   07/24/2019 at Unknown time  . Docosahexaenoic Acid (DHA COMPLETE PO) Take by mouth.     . ferrous sulfate 325 (65 FE) MG tablet Take 325 mg by mouth daily with breakfast.     . FLUoxetine (PROZAC) 10 MG tablet Take 10 mg by mouth daily.   Past Week at Unknown time  . levothyroxine (SYNTHROID) 100 MCG tablet Take 1 tablet (100 mcg total) by mouth daily. 90 tablet 2 07/25/2019 at Unknown time  . loratadine (CLARITIN) 10 MG tablet Take 10 mg by mouth daily as needed for allergies.   Past Month at Unknown time  . OVER THE COUNTER MEDICATION Take 3 each by mouth daily. Prenatal gummies    07/25/2019 at Unknown time  . ondansetron (ZOFRAN ODT) 4 MG disintegrating tablet Take 1 tablet (4 mg total) by mouth every 8 (eight) hours as needed for nausea or vomiting. 20 tablet 0     Review of Systems  Constitutional: Negative for chills, diaphoresis, fatigue and fever.  Eyes: Negative for visual disturbance.  Respiratory: Negative for shortness of breath.   Cardiovascular: Negative for chest pain.  Gastrointestinal: Negative for abdominal pain, constipation, diarrhea, nausea and vomiting.  Genitourinary: Positive for pelvic pain (describes as cramping). Negative for dysuria, flank pain, frequency, urgency, vaginal bleeding and vaginal discharge.  Musculoskeletal: Positive for back pain (low back pain).  Neurological: Negative for dizziness, weakness, light-headedness and headaches.   Physical Exam   Blood pressure 114/61, pulse 72, temperature 98.4 F (36.9 C), temperature source Oral, resp. rate 16, height 5\' 4"  (1.626 m), weight 79.4 kg, last menstrual period 08/10/2018, SpO2 100 %.  Patient Vitals for the past 24 hrs:  BP Temp Temp src Pulse Resp SpO2 Height Weight  07/25/19 1435 114/61 98.4 F (36.9 C) Oral 72 16 100 % 5\' 4"  (1.626 m) -  07/25/19 0855 116/68 98.4 F (36.9 C) Oral 97 17 100 % - 79.4 kg   Physical Exam  Constitutional: She is oriented to person, place, and time. She appears well-developed and well-nourished. No distress.  HENT:  Head: Normocephalic and atraumatic.  Respiratory: Effort normal.  GI: Soft.  Genitourinary: There is no rash, tenderness or lesion on the right labia. There is no rash, tenderness or lesion on the left labia.  Neurological: She is alert and oriented to person, place, and time.  Skin: Skin is warm and dry. She is not diaphoretic.  Psychiatric: She has a normal mood and affect. Her behavior is normal. Judgment and thought content normal.   Results for orders placed or performed during the hospital encounter of 07/25/19 (from the  past 24 hour(s))  Urinalysis, Routine w reflex microscopic     Status: Abnormal   Collection Time: 07/25/19  9:08 AM  Result Value Ref Range   Color, Urine STRAW (A) YELLOW   APPearance CLEAR CLEAR   Specific Gravity, Urine 1.002 (L) 1.005 - 1.030   pH 7.0 5.0 - 8.0   Glucose, UA NEGATIVE NEGATIVE mg/dL   Hgb urine dipstick NEGATIVE NEGATIVE   Bilirubin Urine NEGATIVE NEGATIVE   Ketones, ur NEGATIVE NEGATIVE mg/dL   Protein, ur NEGATIVE NEGATIVE mg/dL   Nitrite NEGATIVE NEGATIVE   Leukocytes,Ua TRACE (A) NEGATIVE   WBC, UA 0-5 0 - 5 WBC/hpf   Bacteria, UA RARE (A) NONE SEEN   Squamous Epithelial /  LPF 0-5 0 - 5  Fetal fibronectin     Status: None   Collection Time: 07/25/19 10:12 AM  Result Value Ref Range   Fetal Fibronectin NEGATIVE NEGATIVE  Type and screen [facility]     Status: None   Collection Time: 07/25/19 10:41 AM  Result Value Ref Range   ABO/RH(D) B POS    Antibody Screen NEG    Sample Expiration      07/28/2019,2359 Performed at [facility], [address], [place], [place] [address]    MAU Course  Procedures  MDM -r/o PTL -UA: straw/SG1.002/trace leuks/rare bacteria, sending urine for culture based on symptoms -CE: long/posterior/fingertip/ballotable -fFN: negative -EFM: reactive       -baseline: 140       -variability: moderate       -accels: present, 15x15       -decels: absent       -TOCO: q2-57min -1L LR given, pt reports no change in frequency of cramping and slight increase in pain -Repeat CE: 2/soft/balotable/90/bag palpable -consulted with Dr. Emelda Fear who agrees with plan for admission -called Dr. Sallye Ober, agrees with plan for admission and will request Lizbeth Bark, CNM to come to bedside in MAU to assess patient -admit to [facility]  Orders Placed This Encounter  Procedures  . Culture, OB Urine    Standing Status:   Standing    Number of Occurrences:   1  . Group B strep by PCR    Standing Status:   Standing     Number of Occurrences:   1  . SARS Coronavirus 2 by RT PCR (hospital order, performed in [facility] hospital lab) Nasopharyngeal Nasopharyngeal Swab    Standing Status:   Standing    Number of Occurrences:   1    Order Specific Question:   Is this test for diagnosis or screening    Answer:   Screening    Order Specific Question:   Symptomatic for COVID-19 as defined by CDC    Answer:   No    Order Specific Question:   Hospitalized for COVID-19    Answer:   No    Order Specific Question:   Admitted to ICU for COVID-19    Answer:   No    Order Specific Question:   Previously tested for COVID-19    Answer:   No    Order Specific Question:   Resident in a congregate (group) care setting    Answer:   No    Order Specific Question:   Employed in healthcare setting    Answer:   Unknown    Order Specific Question:   Pregnant    Answer:   Yes    Order Specific Question:   Has patient completed COVID vaccination(s) (2 doses of Pfizer/Moderna 1 dose of [employer])    Answer:   Unknown  . Urinalysis, Routine w reflex microscopic    Standing Status:   Standing    Number of Occurrences:   1  . Fetal fibronectin    Standing Status:   Standing    Number of Occurrences:   1  . Diet regular Room service appropriate? Yes; Fluid consistency: Thin    Standing Status:   Standing    Number of Occurrences:   1    Order Specific Question:   Room service appropriate?    Answer:   Yes    Order Specific Question:   Fluid consistency:  Answer:   Thin  . Notify physician (specify)    Standing Status:   Standing    Number of Occurrences:   20    Order Specific Question:   Notify Physician    Answer:   for pulse less than 60 or greater than 120    Order Specific Question:   Notify Physician    Answer:   for respiratory rate less than 12 or greater than 28    Order Specific Question:   Notify Physician    Answer:   for temperature greater than 100.4    Order Specific Question:   Notify  Physician    Answer:   for urinary output less than 30 ml/hr    Order Specific Question:   Notify Physician    Answer:   for systolic BP less than 80 or greater than 140    Order Specific Question:   Notify Physician    Answer:   for diastolic BP less than 40 or greater than 90  . Vital signs    While awake, respect sleep.    Standing Status:   Standing    Number of Occurrences:   1  . Defer vaginal exam for vaginal bleeding or PROM <37 weeks    Standing Status:   Standing    Number of Occurrences:   1  . Initiate Oral Care Protocol    Standing Status:   Standing    Number of Occurrences:   1  . Initiate Carrier Fluid Protocol    Standing Status:   Standing    Number of Occurrences:   1  . Fetal monitoring    Standing Status:   Standing    Number of Occurrences:   1  . Full code    Standing Status:   Standing    Number of Occurrences:   1  . Type and screen [facility]     Standing Status:   Standing    Number of Occurrences:   1  . ABO/Rh    Standing Status:   Standing    Number of Occurrences:   1  . Insert peripheral IV    Standing Status:   Standing    Number of Occurrences:   1   Meds ordered this encounter  Medications  . lactated ringers bolus 1,000 mL  . acetaminophen (TYLENOL) tablet 650 mg  . docusate sodium (COLACE) capsule 100 mg  . calcium carbonate (TUMS - dosed in mg elemental calcium) chewable tablet 400 mg of elemental calcium  . prenatal multivitamin tablet 1 tablet  . NIFEdipine (PROCARDIA) capsule 20 mg  . betamethasone acetate-betamethasone sodium phosphate (CELESTONE) injection 12 mg  . NIFEdipine (PROCARDIA) capsule 10 mg  . lactated ringers infusion   Assessment and Plan   1. Preterm uterine contractions   2. [redacted] weeks gestation of pregnancy   3. NST (non-stress test) reactive    -admit to [facility]  Laura Baxter 07/25/2019, 3:04 PM

## 2019-07-26 ENCOUNTER — Ambulatory Visit: Payer: BC Managed Care – PPO | Admitting: Endocrinology

## 2019-07-26 DIAGNOSIS — O360191 Maternal care for anti-D [Rh] antibodies, unspecified trimester, fetus 1: Secondary | ICD-10-CM | POA: Diagnosis not present

## 2019-07-26 LAB — CULTURE, OB URINE: Culture: <10000 — AB

## 2019-07-26 LAB — AMNISURE RUPTURE OF MEMBRANE (ROM) NOT AT ARMC: Amnisure ROM: NEGATIVE

## 2019-07-26 MED ORDER — ONDANSETRON HCL 4 MG/2ML IJ SOLN
4.0000 mg | Freq: Four times a day (QID) | INTRAMUSCULAR | Status: DC | PRN
  Administered 2019-07-26: 4 mg via INTRAVENOUS
  Filled 2019-07-26: qty 2

## 2019-07-26 MED ORDER — NIFEDIPINE 10 MG PO CAPS: 10.0000 mg | ORAL_CAPSULE | ORAL | 0 refills | Status: DC | PRN

## 2019-07-26 MED ORDER — LACTATED RINGERS IV BOLUS
500.0000 mL | Freq: Once | INTRAVENOUS | Status: AC
  Administered 2019-07-26: 500 mL via INTRAVENOUS

## 2019-07-26 NOTE — Progress Notes (Signed)
Discharge instructions and prescriptions given to pt. Discussed signs and symptoms to report to the MD, upcoming appointments, and meds. Pt verbalizes understanding and has no questions at this time. Pt discharged from hospital in stable condition. 

## 2019-07-26 NOTE — Progress Notes (Signed)
RN called to pt's room. Pt stated that she vomited and felt a gush of fluid come out of her vagina. When this RN entered the pt's room, a clear puddle of fluid was noted on the bathroom floor. Pt denies pain, but states she still continues to have a trickling of fluid. Performed a FERN test, but could not confirm a positive result. Notified [place], [place]. Received verbal orders to collect an amnisure and check her cervix. Will continue to monitor.

## 2019-07-26 NOTE — Discharge Summary (Signed)
[place] Discharge Summary     Patient Name: Laura Baxter DOB: Oct 19, 1990 MRN: [id number]  Date of admission: 07/25/2019  Admitting diagnosis: Preterm uterine contractions in third trimester, antepartum [O47.03] Intrauterine pregnancy: [redacted]w[redacted]d     Secondary diagnosis:  Active Problems:   Preterm uterine contractions in third trimester, antepartum   History of Present Illness: Ms. Laura Baxter is a 29 y.o. female, G3P1011, who presents at [redacted]w[redacted]d weeks gestation. The patient has been followed at  [facility]  Her pregnancy has been complicated by:  Patient Active Problem List   Diagnosis Date Noted  . Preterm uterine contractions in third trimester, antepartum 07/25/2019  . Hypothyroidism 02/10/2017  . Obstetric vaginal laceration 08/22/2016  . 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 Family history of cleft lip and palate 08/17/2016  . Shellfish allergy 08/17/2016  . 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 female G78P1011 [redacted]w[redacted]d presenting for abd pain that began @ 1900 on 07/24/19. Pain treated and relieved overnight w/ Tylenol and Benadryl, worsened this morning and accompanied by a burning sensation. Pt was seen and treated in MAU w/ IV fluids, fFN was negative, and cervix was initially closed. Repeat SVE shows preterm dilation to 2/TH/Ballotable. MAU provider notified Dr. Drema Balzarine of need for extended monitoring and observation. Currently, pt endorses active FM, denies LOF and vaginal bleeding. Ctx has decreased since Procardia 20 mg PO. Plan of care discussed to administer antenatal steroids per protocol, Procardia PRN ctx, and continue tocometry.  6/1 No cervical changed noted, second BMZ given. AMnisure negative, Discharged to home with procardia 10mg  PO Q4H PRN.   Physical exam  Vitals:   07/26/19 0356 07/26/19 0727 07/26/19 1120 07/26/19 1208  BP:  110/61 105/60   Pulse:  91 91   Resp:  16 16   Temp:   98.3 F (36.8 C) 98.7 F (37.1 C) 97.9 F (36.6 C)  TempSrc:  Oral Oral Oral  SpO2: 98% 97% 97%   Weight:      Height:       AAO x3, no signs of distress Cardiovascular: RRR Respiratory: Lung fields clear to ausculation GU/GI: Abdomen gravid, non-tender, non-distended, active FM, vertex per Leopold's Extremities: no edema, negative for pain, tenderness, and cords  Cervical exam:Dilation: 1 Effacement (%): soft, multiparous Station: Ballotable Exam by:: [email or web address], CNM FHR: baseline rate 145 / variability moderate / accelerations present / absent decelerations TOCO: occasional ctx, toco not tracing, unable to palpate cxt , presented more llike round ligament pain Pad checked, dried, no appearance of SROM, afebrile. Fern and amnisure negative.   Labs: Lab Results  Component Value Date   WBC 9.1 07/19/2019   HGB 10.0 (L) 07/19/2019   HCT 31.0 (L) 07/19/2019   MCV 84.7 07/19/2019   PLT 211 07/19/2019   CMP Latest Ref Rng & Units 07/19/2019  Glucose 70 - 99 mg/dL 07/21/2019)  BUN 6 - 20 mg/dL 606(T)  Creatinine <0(Z - 1.00 mg/dL 6.01  Sodium 0.93 - 235 mmol/L 136  Potassium 3.5 - 5.1 mmol/L 3.4(L)  Chloride 98 - 111 mmol/L 106  CO2 22 - 32 mmol/L 22  Calcium 8.9 - 10.3 mg/dL 573)    Date of discharge: 07/26/2019 Discharge Diagnoses: False labor-undelivered Discharge instruction: per After Visit Summary and "Baby and Me Booklet".  After visit meds:   Activity:           unrestricted and pelvic rest Advance as tolerated.  Pelvic rest for 6 weeks.  Diet:                routine Medications: Same as prior to admission as well as procardia 10mg  Q4H PRN Postpartum contraception: N/A Condition:  Pt discharge to home in stable condition.  Labor precautions.   Meds: Allergies as of 07/26/2019      Reactions   Iodine Rash   Shellfish Allergy Rash   Latex       Medication List    STOP taking these medications   DHA COMPLETE PO   ferrous sulfate 325 (65 FE) MG tablet    FLUoxetine 20 MG capsule Commonly known as: PROZAC   levothyroxine 100 MCG tablet Commonly known as: SYNTHROID   loratadine 10 MG tablet Commonly known as: CLARITIN   OVER THE COUNTER MEDICATION   Tylenol 325 MG tablet Generic drug: acetaminophen     TAKE these medications   NIFEdipine 10 MG capsule Commonly known as: PROCARDIA Take 1 capsule (10 mg total) by mouth every 4 (four) hours as needed for up to 21 days (Contractions, hold for BP 100/60).   ondansetron 4 MG disintegrating tablet Commonly known as: Zofran ODT Take 1 tablet (4 mg total) by mouth every 8 (eight) hours as needed for nausea or vomiting.       Discharge Follow Up:  Follow-up [facility] Follow up.   Specialty: Obstetrics and Gynecology Why: Next ROB Contact information: [address]  [place] [id number] [place], NP-C, CNM 07/26/2019, 3:57 PM  Noralyn Pick, [place]

## 2019-07-27 DIAGNOSIS — O4290 Premature rupture of membranes, unspecified as to length of time between rupture and onset of labor, unspecified weeks of gestation: Secondary | ICD-10-CM | POA: Diagnosis not present

## 2019-07-27 DIAGNOSIS — Z3A33 33 weeks gestation of pregnancy: Secondary | ICD-10-CM | POA: Diagnosis not present

## 2019-07-29 ENCOUNTER — Encounter (HOSPITAL_COMMUNITY): Payer: Self-pay | Admitting: Obstetrics and Gynecology

## 2019-07-29 ENCOUNTER — Other Ambulatory Visit: Payer: Self-pay

## 2019-07-29 ENCOUNTER — Inpatient Hospital Stay (HOSPITAL_COMMUNITY)
Admission: AD | Admit: 2019-07-29 | Discharge: 2019-07-30 | Disposition: A | Discharge status: Home / Self Care | Payer: BC Managed Care – PPO | Attending: Obstetrics and Gynecology | Admitting: Obstetrics and Gynecology

## 2019-07-29 DIAGNOSIS — E063 Autoimmune thyroiditis: Secondary | ICD-10-CM | POA: Diagnosis not present

## 2019-07-29 DIAGNOSIS — Z3689 Encounter for other specified antenatal screening: Secondary | ICD-10-CM | POA: Diagnosis not present

## 2019-07-29 DIAGNOSIS — Z3A33 33 weeks gestation of pregnancy: Secondary | ICD-10-CM | POA: Diagnosis not present

## 2019-07-29 DIAGNOSIS — O26893 Other specified pregnancy related conditions, third trimester: Secondary | ICD-10-CM | POA: Diagnosis not present

## 2019-07-29 DIAGNOSIS — E039 Hypothyroidism, unspecified: Secondary | ICD-10-CM | POA: Insufficient documentation

## 2019-07-29 DIAGNOSIS — O99283 Endocrine, nutritional and metabolic diseases complicating pregnancy, third trimester: Secondary | ICD-10-CM | POA: Diagnosis not present

## 2019-07-29 DIAGNOSIS — R1030 Lower abdominal pain, unspecified: Secondary | ICD-10-CM | POA: Insufficient documentation

## 2019-07-29 DIAGNOSIS — D509 Iron deficiency anemia, unspecified: Secondary | ICD-10-CM | POA: Diagnosis not present

## 2019-07-29 DIAGNOSIS — O4703 False labor before 37 completed weeks of gestation, third trimester: Secondary | ICD-10-CM | POA: Insufficient documentation

## 2019-07-29 DIAGNOSIS — O99013 Anemia complicating pregnancy, third trimester: Secondary | ICD-10-CM | POA: Diagnosis not present

## 2019-07-29 DIAGNOSIS — O4700 False labor before 37 completed weeks of gestation, unspecified trimester: Secondary | ICD-10-CM

## 2019-07-29 LAB — URINALYSIS, ROUTINE W REFLEX MICROSCOPIC
Bilirubin Urine: NEGATIVE
Glucose, UA: NEGATIVE mg/dL
Hgb urine dipstick: NEGATIVE
Ketones, ur: NEGATIVE mg/dL
Nitrite: NEGATIVE
Protein, ur: NEGATIVE mg/dL
Specific Gravity, Urine: 1.004 — ABNORMAL LOW (ref 1.005–1.030)
pH: 7 (ref 5.0–8.0)

## 2019-07-29 MED ORDER — LACTATED RINGERS IV BOLUS
1000.0000 mL | Freq: Once | INTRAVENOUS | Status: AC
  Administered 2019-07-29: 1000 mL via INTRAVENOUS

## 2019-07-29 MED ORDER — NIFEDIPINE 10 MG PO CAPS
10.0000 mg | ORAL_CAPSULE | ORAL | Status: DC | PRN
  Administered 2019-07-29 – 2019-07-30 (×2): 10 mg via ORAL
  Filled 2019-07-29 (×2): qty 1

## 2019-07-29 NOTE — MAU Note (Signed)
Having contractions since 8:30 pm, gradually got to every 5 min apart, lasting a min each.  Admitted on Monday and stayed to Tues and received Procardia and IV fluids.  Now, taking Procardia every 4 hours.  Last took Procardia at 7 pm.  When wiped at 9 pm -saw reddish blood on tissue and none since. No leaking. Baby moving well today, wasn't moving for 2 hours and then resumed moving when was in lobby.

## 2019-07-29 NOTE — MAU Provider Note (Addendum)
History     CSN: [id number]  Arrival date and time: 07/29/19 2236   First Provider Initiated Contact with Patient 07/29/19 2326      Chief Complaint  Patient presents with  . Contractions   Laura Baxter is a 29 y.o. G3P1 at [redacted]w[redacted]d who presents to MAU with complaints of contractions. Patient reports pain initially started as lower abdominal cramping then started to turn into contractions that radiated around to her back. Reports contractions have been occurring every 5 minutes since around 2000. Rates pain 5/10. She reports one occurrence of blood mucus when she wiped but denies continued bleeding. She denies vaginal discharge or LOF. +FM. She was seen and admitted recently for threatened PTL, sent home with procardia 10mg  q4 PRN for contractions. Patient reports she last took procardia at 16.    OB History    Gravida  3   Para  1   Term  1   Preterm      AB  1   Living  1     SAB  1   TAB      Ectopic      Multiple  0   Live Births  1           Past Medical History:  Diagnosis Date  . History of febrile seizure    INFANT  . History of hyperthyroidism    DX 2015--  TSH LEVELS NORMALIZED WITHOUT ANY TREATMENT  . History of hypotension   . History of ovarian cyst   . Hypothyroidism, secondary ENDOCRINOLOGIST-  DR 120   SECONDARY TO LYMPHOCYTIC THYROIDITIS  . Iron deficiency anemia   . Pilonidal cyst with abscess   . Wears glasses     Past Surgical History:  Procedure Laterality Date  . PILONIDAL CYST EXCISION N/A 06/17/2017   Procedure: CYST EXCISION PILONIDAL;  Surgeon: 06/19/2017, Sheliah Hatch, MD;  Location: [facility];  Service: General;  Laterality: N/A;  . SHOULDER SURGERY Left 2012   capsular repair    Family History  Problem Relation Age of Onset  . Depression Mother   . Anemia Mother   . Heart attack Father   . Hypertension Father   . Hyperlipidemia Father   . Heart disease Father   . Depression Father   .  Glaucoma Father   . Graves' disease Father   . Thyroid disease Father   . Cancer Maternal Grandmother        pancreatic  . Breast cancer Paternal Grandmother   . Leukemia Paternal Grandmother   . Leukemia Paternal Grandfather     Social History   Tobacco Use  . Smoking status: Never Smoker  . Smokeless tobacco: Never Used  Substance Use Topics  . Alcohol use: No  . Drug use: No    Allergies:  Allergies  Allergen Reactions  . Iodine Rash  . Shellfish Allergy Rash  . Latex     Medications Prior to Admission  Medication Sig Dispense Refill Last Dose  . NIFEdipine (PROCARDIA) 10 MG capsule Take 1 capsule (10 mg total) by mouth every 4 (four) hours as needed for up to 21 days (Contractions, hold for BP 100/60). 60 capsule 0 07/29/2019 at Unknown time  . ondansetron (ZOFRAN ODT) 4 MG disintegrating tablet Take 1 tablet (4 mg total) by mouth every 8 (eight) hours as needed for nausea or vomiting. 20 tablet 0 Past Week at Unknown time    Review of Systems  Constitutional: Negative.  Respiratory: Negative.   Cardiovascular: Negative.   Gastrointestinal: Positive for abdominal pain. Negative for constipation, diarrhea, nausea and vomiting.  Genitourinary: Negative.   Musculoskeletal: Negative.   Neurological: Negative.   Psychiatric/Behavioral: Negative.    Physical Exam   Blood pressure 114/68, pulse 76, temperature 98.1 F (36.7 C), temperature source Oral, resp. rate 16, height 5\' 4"  (1.626 m), weight 78.9 kg, last menstrual period 08/10/2018, SpO2 97 %.  Physical Exam  Nursing note and vitals reviewed. Constitutional: She is oriented to person, place, and time. She appears well-developed and well-nourished.  Cardiovascular: Normal rate and regular rhythm.  Respiratory: Effort normal and breath sounds normal. No respiratory distress. She has no wheezes.  GI: Soft. There is no abdominal tenderness. There is no rebound and no guarding.  Mild contractions palpated, gravid    Musculoskeletal:        General: No edema. Normal range of motion.  Neurological: She is alert and oriented to person, place, and time.  Psychiatric: She has a normal mood and affect. Her behavior is normal. Thought content normal.   Initial cervical examination @2336  Dilation: 1 Effacement (%): 50 Cervical Position: Middle Station: -3 Presentation: Vertex Exam by:: CNM  FHR: 140/moderate/+accels/ no decelerations   Toco: UI with occasional mild contractions  MAU Course  Procedures  MDM Orders Placed This Encounter  Procedures  . Urinalysis, Routine w reflex microscopic  . Fetal fibronectin  . Insert peripheral IV   Meds ordered this encounter  Medications  . NIFEdipine (PROCARDIA) capsule 10 mg  . lactated ringers bolus 1,000 mL  . lactated ringers bolus 1,000 mL  . terbutaline (BRETHINE) injection 0.25 mg   Offered patient pain medication @ 2335, patient declines at this time.  2 doses of procardia given, stopped d/t hypotension Second bolus of LR ordered along with terbutaline, plan to recheck cervix around 0230.   FFN negative  Reassessment @ 0130, patient reports that she is feeling better after terbutaline. Denies having any contractions. Plan to recheck in the next hour to assess if contractions return.   Reassessment @ 0215, patient reports that contractions have returned but are not close together nor painful. Cervix rechecked and no cervical change after 3 hours.   Educated and discussed PTL precautions. Discussed reasons to return to MAU. Follow up as scheduled in the office on Monday. Return to MAU as needed. Pt stable at time of discharge. Continue to take Procardia PRN at home.   Assessment and Plan   1. Preterm uterine contractions, antepartum   2. Preterm uterine contractions in third trimester, antepartum   3. [redacted] weeks gestation of pregnancy   4. NST (non-stress test) reactive    Discharge home NST reactive and reassuring  Follow up as  scheduled in the office for prenatal care Return to MAU as needed for reasons discussed and/or emergencies  Continue to take medication at home as prescribed  PTL precautions  Hydration and FKC   Follow-up Information    [facility] up.   Specialty: Obstetrics and Gynecology Why: Follow up as scheduled on Monday  Contact information: [address] [place] [phone]          Allergies as of 07/30/2019      Reactions   Iodine Rash   Shellfish Allergy Rash   Latex       Medication List    TAKE these medications   NIFEdipine 10 MG capsule Commonly known as: PROCARDIA Take 1 capsule (  10 mg total) by mouth every 4 (four) hours as needed for up to 21 days (Contractions, hold for BP 100/60).   ondansetron 4 MG disintegrating tablet Commonly known as: Zofran ODT Take 1 tablet (4 mg total) by mouth every 8 (eight) hours as needed for nausea or vomiting.       Lajean Manes 07/30/2019, 2:55 AM

## 2019-07-30 DIAGNOSIS — O4703 False labor before 37 completed weeks of gestation, third trimester: Secondary | ICD-10-CM

## 2019-07-30 DIAGNOSIS — Z3A33 33 weeks gestation of pregnancy: Secondary | ICD-10-CM

## 2019-07-30 LAB — FETAL FIBRONECTIN: Fetal Fibronectin: NEGATIVE

## 2019-07-30 MED ORDER — TERBUTALINE SULFATE 1 MG/ML IJ SOLN
0.2500 mg | Freq: Once | INTRAMUSCULAR | Status: AC
  Administered 2019-07-30: 0.25 mg via SUBCUTANEOUS
  Filled 2019-07-30: qty 1

## 2019-07-30 MED ORDER — LACTATED RINGERS IV BOLUS
1000.0000 mL | Freq: Once | INTRAVENOUS | Status: AC
  Administered 2019-07-30: 1000 mL via INTRAVENOUS

## 2019-07-31 LAB — CULTURE, OB URINE

## 2019-08-04 DIAGNOSIS — O403XX0 Polyhydramnios, third trimester, not applicable or unspecified: Secondary | ICD-10-CM | POA: Diagnosis not present

## 2019-08-04 DIAGNOSIS — O26843 Uterine size-date discrepancy, third trimester: Secondary | ICD-10-CM | POA: Diagnosis not present

## 2019-08-08 DIAGNOSIS — F4322 Adjustment disorder with anxiety: Secondary | ICD-10-CM | POA: Diagnosis not present

## 2019-08-09 DIAGNOSIS — O403XX0 Polyhydramnios, third trimester, not applicable or unspecified: Secondary | ICD-10-CM | POA: Diagnosis not present

## 2019-08-13 ENCOUNTER — Encounter (HOSPITAL_COMMUNITY): Payer: Self-pay | Admitting: Obstetrics and Gynecology

## 2019-08-13 ENCOUNTER — Inpatient Hospital Stay (HOSPITAL_BASED_OUTPATIENT_CLINIC_OR_DEPARTMENT_OTHER): Payer: BC Managed Care – PPO

## 2019-08-13 ENCOUNTER — Inpatient Hospital Stay (HOSPITAL_COMMUNITY)
Admission: AD | Admit: 2019-08-13 | Payer: BC Managed Care – PPO | Source: Ambulatory Visit | Admitting: Obstetrics and Gynecology

## 2019-08-13 ENCOUNTER — Other Ambulatory Visit: Payer: Self-pay

## 2019-08-13 ENCOUNTER — Inpatient Hospital Stay (HOSPITAL_COMMUNITY)
Admission: AD | Admit: 2019-08-13 | Discharge: 2019-08-13 | Disposition: A | Discharge status: Home / Self Care | Payer: BC Managed Care – PPO | Source: Ambulatory Visit | Attending: Obstetrics and Gynecology | Admitting: Obstetrics and Gynecology

## 2019-08-13 DIAGNOSIS — O4703 False labor before 37 completed weeks of gestation, third trimester: Secondary | ICD-10-CM | POA: Diagnosis not present

## 2019-08-13 DIAGNOSIS — O403XX0 Polyhydramnios, third trimester, not applicable or unspecified: Secondary | ICD-10-CM | POA: Diagnosis not present

## 2019-08-13 DIAGNOSIS — Z832 Family history of diseases of the blood and blood-forming organs and certain disorders involving the immune mechanism: Secondary | ICD-10-CM | POA: Diagnosis not present

## 2019-08-13 DIAGNOSIS — Z8759 Personal history of other complications of pregnancy, childbirth and the puerperium: Secondary | ICD-10-CM | POA: Diagnosis not present

## 2019-08-13 DIAGNOSIS — Z3A35 35 weeks gestation of pregnancy: Secondary | ICD-10-CM | POA: Diagnosis not present

## 2019-08-13 DIAGNOSIS — Z3689 Encounter for other specified antenatal screening: Secondary | ICD-10-CM

## 2019-08-13 DIAGNOSIS — Z8349 Family history of other endocrine, nutritional and metabolic diseases: Secondary | ICD-10-CM | POA: Diagnosis not present

## 2019-08-13 DIAGNOSIS — Z91041 Radiographic dye allergy status: Secondary | ICD-10-CM | POA: Diagnosis not present

## 2019-08-13 DIAGNOSIS — Z8249 Family history of ischemic heart disease and other diseases of the circulatory system: Secondary | ICD-10-CM | POA: Diagnosis not present

## 2019-08-13 DIAGNOSIS — Z91040 Latex allergy status: Secondary | ICD-10-CM | POA: Insufficient documentation

## 2019-08-13 DIAGNOSIS — O479 False labor, unspecified: Secondary | ICD-10-CM

## 2019-08-13 DIAGNOSIS — Z818 Family history of other mental and behavioral disorders: Secondary | ICD-10-CM | POA: Insufficient documentation

## 2019-08-13 DIAGNOSIS — O368130 Decreased fetal movements, third trimester, not applicable or unspecified: Secondary | ICD-10-CM | POA: Diagnosis not present

## 2019-08-13 DIAGNOSIS — O368190 Decreased fetal movements, unspecified trimester, not applicable or unspecified: Secondary | ICD-10-CM

## 2019-08-13 NOTE — MAU Provider Note (Signed)
History     CSN: [id number]  Arrival date and time: 08/13/19 2052   First Provider Initiated Contact with Patient 08/13/19 2107      Chief Complaint  Patient presents with  . Decreased Fetal Movement  . Contractions   HPI   [name] is a 29 y.o. female [phone] @ [redacted]w[redacted]d with polyhydramnios', here in MAU with no fetal movement x 2-3 hours. While talking to the patient to reports she felt 1 fetal kick. She says her baby is normally very active and moves non stop. The contractions were stronger than usual prior to her arrival, however now that she is here she reports the contractions have stopped. States she was feeling her pain in her lower back and in her lower abdomen. Her first baby was born full term. Patient of Dr. Alesia Baxter with [facility]. No bleeding. Contraction pain is consistent with the contraction pain she has had for a while now. She received BMZ for threatened preterm labor.   OB History    Gravida  3   Para  1   Term  1   Preterm      AB  1   Living  1     SAB  1   TAB      Ectopic      Multiple  0   Live Births  1           Past Medical History:  Diagnosis Date  . History of febrile seizure    INFANT  . History of hyperthyroidism    DX 2015--  TSH LEVELS NORMALIZED WITHOUT ANY TREATMENT  . History of hypotension   . History of ovarian cyst   . Hypothyroidism, secondary ENDOCRINOLOGIST-  DR Laura Baxter   SECONDARY TO LYMPHOCYTIC THYROIDITIS  . Iron deficiency anemia   . Pilonidal cyst with abscess   . Wears glasses     Past Surgical History:  Procedure Laterality Date  . PILONIDAL CYST EXCISION N/A 06/17/2017   Procedure: CYST EXCISION PILONIDAL;  Surgeon: Laura Baxter, Laura Bruce, MD;  Location: [facility];  Service: General;  Laterality: N/A;  . SHOULDER SURGERY Left 2012   capsular repair    Family History  Problem Relation Age of Onset  . Depression Mother   . Anemia Mother   . Heart attack Father   .  Hypertension Father   . Hyperlipidemia Father   . Heart disease Father   . Depression Father   . Glaucoma Father   . Graves' disease Father   . Thyroid disease Father   . Cancer Maternal Grandmother        pancreatic  . Breast cancer Paternal Grandmother   . Leukemia Paternal Grandmother   . Leukemia Paternal Grandfather     Social History   Tobacco Use  . Smoking status: Never Smoker  . Smokeless tobacco: Never Used  Substance Use Topics  . Alcohol use: No  . Drug use: No    Allergies:  Allergies  Allergen Reactions  . Iodine Rash  . Shellfish Allergy Rash  . Latex     Medications Prior to Admission  Medication Sig Dispense Refill Last Dose  . Ferrous Sulfate (IRON PO) Take by mouth.   Past Week at Unknown time  . ondansetron (ZOFRAN ODT) 4 MG disintegrating tablet Take 1 tablet (4 mg total) by mouth every 8 (eight) hours as needed for nausea or vomiting. 20 tablet 0 Past Week at Unknown time  . Prenatal-FeFum-FA-DHA w/o A (  PRENATAL + DHA PO) Take by mouth.   08/13/2019 at Unknown time  . NIFEdipine (PROCARDIA) 10 MG capsule Take 1 capsule (10 mg total) by mouth every 4 (four) hours as needed for up to 21 days (Contractions, hold for BP 100/60). 60 capsule 0    No results found for this or any previous visit (from the past 48 hour(s)).  Review of Systems  Gastrointestinal: Positive for abdominal pain.  Genitourinary: Negative for vaginal bleeding and vaginal discharge.   Physical Exam   Blood pressure 114/69, pulse 95, temperature 98.2 F (36.8 C), temperature source Oral, resp. rate 18, last menstrual period 08/10/2018, SpO2 100 %.  Physical Exam  Constitutional:  Non-toxic appearance. She does not appear ill. No distress.  Respiratory: Effort normal.  GI: Soft. Normal appearance.  Genitourinary:    Genitourinary Comments: Cervix: 1 cm, thick, anterior.    Musculoskeletal:        General: Normal range of motion.  Neurological: She is alert.  Skin: Skin is  warm. She is not diaphoretic.  Psychiatric: Mood normal.   Fetal Tracing: Baseline: 140 bpm Variability: Moderate  Accelerations: 15x15 Decelerations: None Toco: Occasional contraction.  MAU Course  Procedures  MDM  AFI 24.8; consistent with mild polyhydramnios',  which is down from AFI on 6/16 @ 26 Patient felt 3 fetal movements prior to [place], then 3 movements during [place] and felt the baby have hiccups.  BPP 10/10   Assessment and Plan   A:  1. NST (non-stress test) reactive   2. Decreased fetal movement   3. [redacted] weeks gestation of pregnancy   4. Braxton Hicks contractions     P:  Discharge home in stable condition Strict return precautions Fetal kick counts reviewed Preterm labor precautions Ok to take procardia, patient has rx Return to MAU if symptoms worsen Keep your OB appt on Tuesday in the office  Laura Baxter, Laura Rutherford, NP 08/13/2019 10:42 PM

## 2019-08-13 NOTE — Discharge Instructions (Signed)
Braxton Hicks Contractions °Contractions of the uterus can occur throughout pregnancy, but they are not always a sign that you are in labor. You may have practice contractions called Braxton Hicks contractions. These false labor contractions are sometimes confused with true labor. °What are Braxton Hicks contractions? °Braxton Hicks contractions are tightening movements that occur in the muscles of the uterus before labor. Unlike true labor contractions, these contractions do not result in opening (dilation) and thinning of the cervix. Toward the end of pregnancy (32-34 weeks), Braxton Hicks contractions can happen more often and may become stronger. These contractions are sometimes difficult to tell apart from true labor because they can be very uncomfortable. You should not feel embarrassed if you go to the hospital with false labor. °Sometimes, the only way to tell if you are in true labor is for your health care provider to look for changes in the cervix. The health care provider will do a physical exam and may monitor your contractions. If you are not in true labor, the exam should show that your cervix is not dilating and your water has not broken. °If there are no other health problems associated with your pregnancy, it is completely safe for you to be sent home with false labor. You may continue to have Braxton Hicks contractions until you go into true labor. °How to tell the difference between true labor and false labor °True labor °· Contractions last 30-70 seconds. °· Contractions become very regular. °· Discomfort is usually felt in the top of the uterus, and it spreads to the lower abdomen and low back. °· Contractions do not go away with walking. °· Contractions usually become more intense and increase in frequency. °· The cervix dilates and gets thinner. °False labor °· Contractions are usually shorter and not as strong as true labor contractions. °· Contractions are usually irregular. °· Contractions  are often felt in the front of the lower abdomen and in the groin. °· Contractions may go away when you walk around or change positions while lying down. °· Contractions get weaker and are shorter-lasting as time goes on. °· The cervix usually does not dilate or become thin. °Follow these instructions at home: ° °· Take over-the-counter and prescription medicines only as told by your health care provider. °· Keep up with your usual exercises and follow other instructions from your health care provider. °· Eat and drink lightly if you think you are going into labor. °· If Braxton Hicks contractions are making you uncomfortable: °? Change your position from lying down or resting to walking, or change from walking to resting. °? Sit and rest in a tub of warm water. °? Drink enough fluid to keep your urine pale yellow. Dehydration may cause these contractions. °? Do slow and deep breathing several times an hour. °· Keep all follow-up prenatal visits as told by your health care provider. This is important. °Contact a health care provider if: °· You have a fever. °· You have continuous pain in your abdomen. °Get help right away if: °· Your contractions become stronger, more regular, and closer together. °· You have fluid leaking or gushing from your vagina. °· You pass blood-tinged mucus (bloody show). °· You have bleeding from your vagina. °· You have low back pain that you never had before. °· You feel your baby’s head pushing down and causing pelvic pressure. °· Your baby is not moving inside you as much as it used to. °Summary °· Contractions that occur before labor are   called Braxton Hicks contractions, false labor, or practice contractions. °· Braxton Hicks contractions are usually shorter, weaker, farther apart, and less regular than true labor contractions. True labor contractions usually become progressively stronger and regular, and they become more frequent. °· Manage discomfort from Braxton Hicks contractions  by changing position, resting in a warm bath, drinking plenty of water, or practicing deep breathing. °This information is not intended to replace advice given to you by your health care provider. Make sure you discuss any questions you have with your health care provider. °Document Revised: 01/23/2017 Document Reviewed: 06/26/2016 °Elsevier Patient Education © 2020 [employer] ° °

## 2019-08-13 NOTE — MAU Note (Signed)
Pt reports to MAU c/o NO FM in the last 2.5 hours. No bleeding or LOF. Ctx every 5-6 min that are a 5/10 on the pain scale.

## 2019-08-14 LAB — URINALYSIS, ROUTINE W REFLEX MICROSCOPIC
Bacteria, UA: NONE SEEN
Bilirubin Urine: NEGATIVE
Glucose, UA: 150 mg/dL — AB
Ketones, ur: NEGATIVE mg/dL
Nitrite: NEGATIVE
Protein, ur: NEGATIVE mg/dL
Specific Gravity, Urine: 1.003 — ABNORMAL LOW (ref 1.005–1.030)
pH: 7 (ref 5.0–8.0)

## 2019-08-16 DIAGNOSIS — O403XX0 Polyhydramnios, third trimester, not applicable or unspecified: Secondary | ICD-10-CM | POA: Diagnosis not present

## 2019-08-17 DIAGNOSIS — F4322 Adjustment disorder with anxiety: Secondary | ICD-10-CM | POA: Diagnosis not present

## 2019-08-23 ENCOUNTER — Other Ambulatory Visit: Payer: Self-pay | Admitting: Obstetrics and Gynecology

## 2019-08-24 DIAGNOSIS — F4322 Adjustment disorder with anxiety: Secondary | ICD-10-CM | POA: Diagnosis not present

## 2019-08-25 ENCOUNTER — Encounter (HOSPITAL_COMMUNITY): Payer: Self-pay | Admitting: *Deleted

## 2019-08-25 ENCOUNTER — Telehealth (HOSPITAL_COMMUNITY): Payer: Self-pay | Admitting: *Deleted

## 2019-08-25 DIAGNOSIS — O403XX0 Polyhydramnios, third trimester, not applicable or unspecified: Secondary | ICD-10-CM | POA: Diagnosis not present

## 2019-08-25 DIAGNOSIS — Z3A37 37 weeks gestation of pregnancy: Secondary | ICD-10-CM | POA: Diagnosis not present

## 2019-08-25 NOTE — Telephone Encounter (Signed)
Preadmission screen  

## 2019-08-27 ENCOUNTER — Other Ambulatory Visit: Payer: Self-pay

## 2019-08-27 ENCOUNTER — Encounter (HOSPITAL_COMMUNITY): Payer: Self-pay | Admitting: Obstetrics and Gynecology

## 2019-08-27 ENCOUNTER — Inpatient Hospital Stay (HOSPITAL_COMMUNITY)
Admission: AD | Admit: 2019-08-27 | Discharge: 2019-08-27 | Disposition: A | Discharge status: Home / Self Care | Payer: BC Managed Care – PPO | Attending: Obstetrics and Gynecology | Admitting: Obstetrics and Gynecology

## 2019-08-27 DIAGNOSIS — O471 False labor at or after 37 completed weeks of gestation: Secondary | ICD-10-CM | POA: Diagnosis not present

## 2019-08-27 DIAGNOSIS — O9A213 Injury, poisoning and certain other consequences of external causes complicating pregnancy, third trimester: Secondary | ICD-10-CM | POA: Diagnosis not present

## 2019-08-27 DIAGNOSIS — Z3A37 37 weeks gestation of pregnancy: Secondary | ICD-10-CM | POA: Diagnosis not present

## 2019-08-27 DIAGNOSIS — O403XX0 Polyhydramnios, third trimester, not applicable or unspecified: Secondary | ICD-10-CM | POA: Insufficient documentation

## 2019-08-27 DIAGNOSIS — O99283 Endocrine, nutritional and metabolic diseases complicating pregnancy, third trimester: Secondary | ICD-10-CM | POA: Diagnosis not present

## 2019-08-27 DIAGNOSIS — O4703 False labor before 37 completed weeks of gestation, third trimester: Secondary | ICD-10-CM

## 2019-08-27 DIAGNOSIS — W19XXXA Unspecified fall, initial encounter: Secondary | ICD-10-CM | POA: Diagnosis not present

## 2019-08-27 DIAGNOSIS — Z3689 Encounter for other specified antenatal screening: Secondary | ICD-10-CM

## 2019-08-27 DIAGNOSIS — Z888 Allergy status to other drugs, medicaments and biological substances status: Secondary | ICD-10-CM | POA: Insufficient documentation

## 2019-08-27 DIAGNOSIS — W010XXA Fall on same level from slipping, tripping and stumbling without subsequent striking against object, initial encounter: Secondary | ICD-10-CM | POA: Insufficient documentation

## 2019-08-27 DIAGNOSIS — Z8349 Family history of other endocrine, nutritional and metabolic diseases: Secondary | ICD-10-CM | POA: Diagnosis not present

## 2019-08-27 DIAGNOSIS — E063 Autoimmune thyroiditis: Secondary | ICD-10-CM | POA: Insufficient documentation

## 2019-08-27 DIAGNOSIS — Z79899 Other long term (current) drug therapy: Secondary | ICD-10-CM | POA: Insufficient documentation

## 2019-08-27 NOTE — MAU Note (Signed)
Pt stated about 2 hours ago she fell over a bedpost at home hit her right leg and landed on her back. Did not hit abd. Good fetal movement felt since fall. Denies any vag bleeding or leaking . C/O pain to her right leg and lower back.

## 2019-08-27 NOTE — MAU Provider Note (Signed)
History     CSN: [id number]  Arrival date and time: 08/27/19 1545   First Provider Initiated Contact with Patient 08/27/19 1703      Chief Complaint  Patient presents with  . Fall   Ms. Laura Baxter is a 29 y.o. [phone] at [redacted]w[redacted]d who presents to MAU for a fall that occurred around [facility]. Patient reports she tripped over a bedframe with her right leg and as she was falling twisted around and landed on her back. Patient denies hitting her abdomen or head. Patient reports she landed very hard flat on her back. Patient reports she is feeling contractions, but reports they started about a month ago and reports they have not increased in pain or frequency since her fall.  Pt denies VB, LOF, decreased FM, vaginal discharge/odor/itching. Pt denies N/V, abdominal pain, constipation, diarrhea, or urinary problems. Pt denies fever, chills, fatigue, sweating or changes in appetite. Pt denies SOB or chest pain. Pt denies dizziness, HA, light-headedness, weakness.  Problems this pregnancy include: polyhydramnios. Allergies? Iodine, shellfish, latex Current medications/supplements? PNVs, levothyroxine, fluoxetine, PO iron, DHA Prenatal care provider? [facility], next appt 08/31/2019   OB History    Gravida  3   Para  1   Term  1   Preterm      AB  1   Living  1     SAB  1   TAB      Ectopic      Multiple  0   Live Births  1           Past Medical History:  Diagnosis Date  . History of febrile seizure    INFANT  . History of hyperthyroidism    DX 2015--  TSH LEVELS NORMALIZED WITHOUT ANY TREATMENT  . History of hypotension   . History of ovarian cyst   . 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 Iron deficiency anemia   . Pilonidal cyst with abscess   . Wears glasses     Past Surgical History:  Procedure Laterality Date  . PILONIDAL CYST EXCISION N/A 06/17/2017   Procedure: CYST EXCISION PILONIDAL;   Surgeon: Sheliah Hatch, De Blanch, MD;  Location: [facility];  Service: General;  Laterality: N/A;  . SHOULDER SURGERY Left 2012   capsular repair    Family History  Problem Relation Age of Onset  . Depression Mother   . Anemia Mother   . Heart attack Father   . Hypertension Father   . Hyperlipidemia Father   . Heart disease Father   . Depression Father   . Glaucoma Father   . Graves' disease Father   . Thyroid disease Father   . Cancer Maternal Grandmother        pancreatic  . Breast cancer Paternal Grandmother   . Leukemia Paternal Grandmother   . Leukemia Paternal Grandfather     Social History   Tobacco Use  . Smoking status: Never Smoker  . Smokeless tobacco: Never Used  Vaping Use  . Vaping Use: Never used  Substance Use Topics  . Alcohol use: No  . Drug use: No    Allergies:  Allergies  Allergen Reactions  . Iodine Rash  . Shellfish Allergy Rash  . Latex     Medications Prior to Admission  Medication Sig Dispense Refill Last Dose  . ondansetron (ZOFRAN ODT) 4 MG disintegrating tablet Take 1 tablet (4 mg total) by mouth every 8 (eight) hours  as needed for nausea or vomiting. 20 tablet 0 Past Month at Unknown time  . Prenatal-FeFum-FA-DHA w/o A (PRENATAL + DHA PO) Take by mouth.   08/27/2019 at Unknown time  . Ferrous Sulfate (IRON PO) Take by mouth.     Marland Kitchen NIFEdipine (PROCARDIA) 10 MG capsule Take 1 capsule (10 mg total) by mouth every 4 (four) hours as needed for up to 21 days (Contractions, hold for BP 100/60). 60 capsule 0     Review of Systems  Constitutional: Negative for chills, diaphoresis, fatigue and fever.  Eyes: Negative for visual disturbance.  Respiratory: Negative for shortness of breath.   Cardiovascular: Negative for chest pain.  Gastrointestinal: Positive for abdominal pain (ctx x1 month). Negative for constipation, diarrhea, nausea and vomiting.  Genitourinary: Negative for dysuria, flank pain, frequency, pelvic pain,  urgency, vaginal bleeding and vaginal discharge.  Neurological: Negative for dizziness, weakness, light-headedness and headaches.   Physical Exam   Blood pressure 116/63, pulse 99, temperature 98.2 F (36.8 C), resp. rate 18, last menstrual period 08/10/2018, SpO2 99 %.  Patient Vitals for the past 24 hrs:  BP Temp Pulse Resp SpO2  08/27/19 1604 116/63 98.2 F (36.8 C) 99 18 99 %   Physical Exam Constitutional:      General: She is not in acute distress.    Appearance: She is well-developed. She is not diaphoretic.  HENT:     Head: Normocephalic and atraumatic.  Pulmonary:     Effort: Pulmonary effort is normal.  Abdominal:     General: There is no distension.     Palpations: Abdomen is soft. There is no mass.     Tenderness: There is no abdominal tenderness. There is no guarding or rebound.  Skin:    General: Skin is warm and dry.  Neurological:     Mental Status: She is alert and oriented to person, place, and time.  Psychiatric:        Behavior: Behavior normal.        Thought Content: Thought content normal.        Judgment: Judgment normal.     MAU Course  Procedures  MDM -s/p 2PM fall, no direct abdominal or head trauma -will monitor for 4 hours -ctx present on monitor and patient is feeling, but patient reports she has been experiencing contractions for a month without worsening since her fall -EFM (hour 1): reactive with variable       -baseline: 140/130       -variability: moderate       -accels: present, 15x15       -decels: absent       -TOCO: ctx q53min -EFM (hour 2): reactive with tachycardia vs. Prolonged accels       -baseline: ?140       -variability: moderate/marked       -accels: present, 15x15       -decels: few variables       -TOCO: approx q36min -EFM: reactive with variables       -baseline: 150       -variability: moderate       -accels: present, 15x15       -decels: few variables       -TOCO: quiet -EFM: reactive       -baseline:  130       -variability: moderate/marked       -accels: present, 15x15       -decels: absent       -TOCO: irritability -re-evaluation of cervix  by RN shows cervix unchanged -consulted with Dr. Debroah Loop about tracing, Dr. Debroah Loop reviewed tracing and patient OK to be discharged home -pt discharged to home in stable condition  Assessment and Plan   1. Traumatic injury during pregnancy in third trimester   2. Preterm uterine contractions in third trimester, antepartum   3. [redacted] weeks gestation of pregnancy   4. NST (non-stress test) reactive    Allergies as of 08/27/2019      Reactions   Iodine Rash   Shellfish Allergy Rash   Latex       Medication List    TAKE these medications   IRON PO Take by mouth.   NIFEdipine 10 MG capsule Commonly known as: PROCARDIA Take 1 capsule (10 mg total) by mouth every 4 (four) hours as needed for up to 21 days (Contractions, hold for BP 100/60).   ondansetron 4 MG disintegrating tablet Commonly known as: Zofran ODT Take 1 tablet (4 mg total) by mouth every 8 (eight) hours as needed for nausea or vomiting.   PRENATAL + DHA PO Take by mouth.      -discussed s/sx of placental abruption -pt to keep OB appt on 07/07/021 as planned -return MAU precautions given -pt discharged to home in stable condition  Joni Reining E Shakya Sebring 08/27/2019, 8:05 PM

## 2019-08-27 NOTE — Discharge Instructions (Signed)
Abdominal Pain During Pregnancy  Abdominal pain is common during pregnancy, and has many possible causes. Some causes are more serious than others, and sometimes the cause is not known. Abdominal pain can be a sign that labor is starting. It can also be caused by normal growth and stretching of muscles and ligaments during pregnancy. Always tell your health care provider if you have any abdominal pain. Follow these instructions at home:  Do not have sex or put anything in your vagina until your pain goes away completely.  Get plenty of rest until your pain improves.  Drink enough fluid to keep your urine pale yellow.  Take over-the-counter and prescription medicines only as told by your health care provider.  Keep all follow-up visits as told by your health care provider. This is important. Contact a health care provider if:  Your pain continues or gets worse after resting.  You have lower abdominal pain that: ? Comes and goes at regular intervals. ? Spreads to your back. ? Is similar to menstrual cramps.  You have pain or burning when you urinate. Get help right away if:  You have a fever or chills.  You have vaginal bleeding.  You are leaking fluid from your vagina.  You are passing tissue from your vagina.  You have vomiting or diarrhea that lasts for more than 24 hours.  Your baby is moving less than usual.  You feel very weak or faint.  You have shortness of breath.  You develop severe pain in your upper abdomen. Summary  Abdominal pain is common during pregnancy, and has many possible causes.  If you experience abdominal pain during pregnancy, tell your health care provider right away.  Follow your health care provider's home care instructions and keep all follow-up visits as directed. This information is not intended to replace advice given to you by your health care provider. Make sure you discuss any questions you have with your health care  provider. Document Revised: 05/31/2018 Document Reviewed: 05/15/2016 Elsevier Patient Education  2020 [employer].      Placental Abruption  The placenta is the organ formed during pregnancy. It carries oxygen and nutrients to the unborn baby (fetus). The placenta is the baby's life support system. In normal circumstances, it remains attached to the inside of the uterus until the baby is born. Placental abruption is a condition in which the placenta partly or completely separates from the uterus before the baby is born. Placental abruption is rare, but it can happen any time after 20 weeks of pregnancy. A small separation may not cause problems, but a large separation may be dangerous for you and your baby. A large separation is usually an emergency that requires treatment right away. What are the causes? In most cases, the cause of this condition is not known. What increases the risk? This condition is more likely to develop in women who:  Have experienced a recent trauma such as a fall, an injury to the abdomen, or a car accident.  Have had a previous placental abruption.  Have high blood pressure.  Smoke cigarettes, use alcohol, or use drugs, such as cocaine.  Have multiples (twins, triplets, or more).  Have too much amniotic fluid (polyhydramnios).  Are 43 years of age or older. What are the signs or symptoms? Symptoms of this condition can be mild or severe. A small placental abruption may not cause symptoms, or it may cause mild symptoms, which may include:  Mild pain in the abdomen or  lower back.  Slight bleeding in the vagina. A severe placental abruption will cause symptoms. The symptoms will depend on the size of the separation and the stage of pregnancy. They may include:  Severe pain in the abdomen or lower back.  Bleeding from the vagina.  Ongoing contractions of your uterus with little or no relaxation of your uterus between contractions. How is this  diagnosed? There are no tests to diagnose placental abruption. However, your health care provider may suspect that you have this condition based on:  Your symptoms.  A physical exam.  Ultrasound findings.  Blood tests. How is this treated? Treatment for placental abruption depends on the severity of the condition.  Mild cases may be monitored through close observation. You may be admitted to the hospital during this time.  Severe cases may require emergency treatment. This may involve: ? Admission to the hospital. ? Emergency cesarean delivery of your baby. ? A blood transfusion or other fluids given through an IV. Follow these instructions at home: Activity  Get plenty of rest and sleep.  Do not have sex until your health care provider says it is okay. Lifestyle  Do not use drugs.  Do not drink alcohol.  Do not use tampons or douche unless your health care provider says it is okay.  Do not use any products that contain nicotine or tobacco, such as cigarettes, e-cigarettes, and chewing tobacco. If you need help quitting, ask your health care provider. General instructions  Take over-the-counter and prescription medicines only as told by your health care provider.  Do not take any medicines that your health care provider has not approved.  Arrange for help at home so you can get adequate rest.  Keep all follow-up visits as told by your health care provider. This is important. Contact a health care provider if:  You have vaginal spotting.  You are having mild, regular contractions. Get help right away if:  You have moderate or heavy vaginal bleeding or spotting.  You have severe pain in the abdomen.  You have continuous uterine contractions.  You have a hard, tender uterus.  You have any type of trauma, such as an injury to the abdomen, a fall, or a car accident.  You do not feel the baby move, or the baby moves very little. Summary  Placental abruption is  a condition in which the placenta partly or completely separates from the uterus before the baby is born.  Placental abruption is a medical emergency that requires treatment right away.  Contact a health care provider if you have vaginal bleeding, or if you have mild, regular contractions.  Get help right away if you have heavy vaginal bleeding, severe pain in the abdomen, continuous uterine contractions, or you do not feel the baby move.  Keep all follow-up visits as told by your health care provider. This is important. This information is not intended to replace advice given to you by your health care provider. Make sure you discuss any questions you have with your health care provider. Document Revised: 08/05/2018 Document Reviewed: 08/05/2018 Elsevier Patient Education  2020 [employer].     Signs and Symptoms of Labor Labor is your body's natural process of moving your baby, placenta, and umbilical cord out of your uterus. The process of labor usually starts when your baby is full-term, between 62 and 40 weeks of pregnancy. How will I know when I am close to going into labor? As your body prepares for labor and the  birth of your baby, you may notice the following symptoms in the weeks and days before true labor starts:  Having a strong desire to get your home ready to receive your new baby. This is called nesting. Nesting may be a sign that labor is approaching, and it may occur several weeks before birth. Nesting may involve cleaning and organizing your home.  Passing a small amount of thick, bloody mucus out of your vagina (normal bloody show or losing your mucus plug). This may happen more than a week before labor begins, or it might occur right before labor begins as the opening of the cervix starts to widen (dilate). For some women, the entire mucus plug passes at once. For others, smaller portions of the mucus plug may gradually pass over several days.  Your baby moving  (dropping) lower in your pelvis to get into position for birth (lightening). When this happens, you may feel more pressure on your bladder and pelvic bone and less pressure on your ribs. This may make it easier to breathe. It may also cause you to need to urinate more often and have problems with bowel movements.  Having "practice contractions" (Braxton Hicks contractions) that occur at irregular (unevenly spaced) intervals that are more than 10 minutes apart. This is also called false labor. False labor contractions are common after exercise or sexual activity, and they will stop if you change position, rest, or drink fluids. These contractions are usually mild and do not get stronger over time. They may feel like: ? A backache or back pain. ? Mild cramps, similar to menstrual cramps. ? Tightening or pressure in your abdomen. Other early symptoms that labor may be starting soon include:  Nausea or loss of appetite.  Diarrhea.  Having a sudden burst of energy, or feeling very tired.  Mood changes.  Having trouble sleeping. How will I know when labor has begun? Signs that true labor has begun may include:  Having contractions that come at regular (evenly spaced) intervals and increase in intensity. This may feel like more intense tightening or pressure in your abdomen that moves to your back. ? Contractions may also feel like rhythmic pain in your upper thighs or back that comes and goes at regular intervals. ? For first-time mothers, this change in intensity of contractions often occurs at a more gradual pace. ? Women who have given birth before may notice a more rapid progression of contraction changes.  Having a feeling of pressure in the vaginal area.  Your water breaking (rupture of membranes). This is when the sac of fluid that surrounds your baby breaks. When this happens, you will notice fluid leaking from your vagina. This may be clear or blood-tinged. Labor usually starts within  24 hours of your water breaking, but it may take longer to begin. ? Some women notice this as a gush of fluid. ? Others notice that their underwear repeatedly becomes damp. Follow these instructions at home:   When labor starts, or if your water breaks, call your health care provider or nurse care line. Based on your situation, they will determine when you should go in for an exam.  When you are in early labor, you may be able to rest and manage symptoms at home. Some strategies to try at home include: ? Breathing and relaxation techniques. ? Taking a warm bath or shower. ? Listening to music. ? Using a heating pad on the lower back for pain. If you are directed to use heat:  Place a towel between your skin and the heat source.  Leave the heat on for 20-30 minutes.  Remove the heat if your skin turns bright red. This is especially important if you are unable to feel pain, heat, or cold. You may have a greater risk of getting burned. Get help right away if:  You have painful, regular contractions that are 5 minutes apart or less.  Labor starts before you are [redacted] weeks along in your pregnancy.  You have a fever.  You have a headache that does not go away.  You have bright red blood coming from your vagina.  You do not feel your baby moving.  You have a sudden onset of: ? Severe headache with vision problems. ? Nausea, vomiting, or diarrhea. ? Chest pain or shortness of breath. These symptoms may be an emergency. If your health care provider recommends that you go to the hospital or birth center where you plan to deliver, do not drive yourself. Have someone else drive you, or call emergency services (911 in the [place] Summary  Labor is your body's natural process of moving your baby, placenta, and umbilical cord out of your uterus.  The process of labor usually starts when your baby is full-term, between 15 and 40 weeks of pregnancy.  When labor starts, or if your water breaks,  call your health care provider or nurse care line. Based on your situation, they will determine when you should go in for an exam. This information is not intended to replace advice given to you by your health care provider. Make sure you discuss any questions you have with your health care provider. Document Revised: 11/10/2016 Document Reviewed: 07/18/2016 Elsevier Patient Education  2020 [employer]       Preventing Unintended Injuries, Adult Unintended injuries are accidents that result in harm. They are very common and can happen almost anywhere. Most occur at home or in the car. Examples include motor vehicle accidents, drownings, machinery accidents, drug overdoses, falls, and fires. Unintended injuries can be serious. Most unintended injuries are preventable. Taking some simple steps in your daily life and developing safe habits can reduce your risk of unintended injury. Why are these safety measures and habits important? Taking steps to improve safety can:  Reduce your chance of having an accident.  Reduce the likelihood of injury if you do have an accident.  Lower your chance of severe injury in an accident, including the risk for disability, traumatic brain injury, or even death.  Help prevent others from having accidents in your home.  Help you avoid high medical costs. What actions can I take to prevent injuries?  Home and workplace  Look for tripping hazards around your home, such as loose rugs and cords where people walk. Use no-slip mats and install grab bars in the bathroom.  Make sure you do not plug too many things into an outlet at one time.  Always be safe with tools and equipment: ? Be careful when using ladders or power tools at home and work. Put these items away as soon as you are done using them. ? Do not use power tools when you are sleepy or impaired by alcohol or drugs.  Consider using night-lights to help prevent falls when it is dark.  Store  Patent attorney in a safe place. Use them carefully.  Install smoke detectors and carbon monoxide detectors in your home. Check the batteries yearly.  Learn safety guidelines and techniques: ? Make sure that you  and everyone in your home knows basic first aid, including what to do when someone is choking. ? Make sure that your workplace is safe and follows guidelines from the Occupational Safety and Health Administration Teaching laboratory technician).  If you have firearms in your home, store them in a locked safe.  Do not misuse medicines. This means that you should not take a medicine other than how it is prescribed, and you should not take someone else's medicine. Activity  When driving or riding in a car, always wear your seat belt.  If you drive: ? Obey the speed limit and traffic laws. ? Do not text, talk, or use your phone or other mobile devices while driving. ? Do not drive when you are tired. If you feel like you may fall asleep while driving, pull over at a safe location and take a break or switch drivers. ? Do not drive after drinking or using drugs. Plan for a designated driver or another way to go home. Do not ride in a car with someone who has been using drugs or alcohol.  Do not use drugs or alcohol while doing activities that require a lot of energy or concentration. Limit the amount of alcohol you drink before or during recreational activities.  Use a handrail when walking up and down stairs.  When walking outside, walk on a sidewalk if possible.  Have your eyes checked regularly.  Take swimming lessons to prevent water-related accidents. Wear a life jacket for water activities such as boating and water sports.  Wear appropriate safety gear when doing activities or participating in sports. This includes wearing a helmet when riding a bicycle, skiing, or snowboarding. What actions can I take to make my home safer for children? Take these steps to help prevent children from being injured in  your home:  Keep medicines and cleaning products secured and out of the reach of children.  Put plug covers on all electrical outlets.  Install window guards on upper levels of your home.  Keep small items out of the reach of young children. A child could choke on anything that is small enough to fit inside a toilet paper tube.  Practice kitchen safety. Handles of pots and pans should always be turned inward when cooking on the stove. Where to find more information  [place] Department of Health and Human Services: ThisPath.fi  Office of Disease Prevention and Health Promotion: www.healthypeople.gov  Centers for Disease Control and Prevention: TextMar[employer].ca Summary  Most unintended injuries are preventable.  Making simple changes, such as changing your home environment and your driving habits, can prevent many unintended injuries.  Take precautions at work and during recreational activities to avoid unintended injuries.  You can take many actions to make your home safer for children, including keeping medicines and cleaning products secured and out of their reach. This information is not intended to replace advice given to you by your health care provider. Make sure you discuss any questions you have with your health care provider. Document Revised: 09/27/2018 Document Reviewed: 09/27/2018 Elsevier Patient Education  2020 [employer].

## 2019-08-28 ENCOUNTER — Encounter (HOSPITAL_COMMUNITY): Payer: Self-pay | Admitting: Obstetrics and Gynecology

## 2019-08-28 ENCOUNTER — Inpatient Hospital Stay (HOSPITAL_BASED_OUTPATIENT_CLINIC_OR_DEPARTMENT_OTHER): Payer: BC Managed Care – PPO

## 2019-08-28 ENCOUNTER — Inpatient Hospital Stay (HOSPITAL_COMMUNITY)
Admission: AD | Admit: 2019-08-28 | Discharge: 2019-08-28 | Disposition: A | Discharge status: Home / Self Care | Payer: BC Managed Care – PPO | Attending: Obstetrics and Gynecology | Admitting: Obstetrics and Gynecology

## 2019-08-28 ENCOUNTER — Other Ambulatory Visit: Payer: Self-pay

## 2019-08-28 DIAGNOSIS — Z79899 Other long term (current) drug therapy: Secondary | ICD-10-CM | POA: Insufficient documentation

## 2019-08-28 DIAGNOSIS — E063 Autoimmune thyroiditis: Secondary | ICD-10-CM | POA: Diagnosis not present

## 2019-08-28 DIAGNOSIS — O99283 Endocrine, nutritional and metabolic diseases complicating pregnancy, third trimester: Secondary | ICD-10-CM | POA: Diagnosis not present

## 2019-08-28 DIAGNOSIS — R1012 Left upper quadrant pain: Secondary | ICD-10-CM | POA: Insufficient documentation

## 2019-08-28 DIAGNOSIS — O471 False labor at or after 37 completed weeks of gestation: Secondary | ICD-10-CM | POA: Insufficient documentation

## 2019-08-28 DIAGNOSIS — O4703 False labor before 37 completed weeks of gestation, third trimester: Secondary | ICD-10-CM

## 2019-08-28 DIAGNOSIS — Z3A37 37 weeks gestation of pregnancy: Secondary | ICD-10-CM

## 2019-08-28 DIAGNOSIS — Z3689 Encounter for other specified antenatal screening: Secondary | ICD-10-CM

## 2019-08-28 DIAGNOSIS — Z8349 Family history of other endocrine, nutritional and metabolic diseases: Secondary | ICD-10-CM | POA: Insufficient documentation

## 2019-08-28 DIAGNOSIS — R109 Unspecified abdominal pain: Secondary | ICD-10-CM

## 2019-08-28 DIAGNOSIS — Z888 Allergy status to other drugs, medicaments and biological substances status: Secondary | ICD-10-CM | POA: Insufficient documentation

## 2019-08-28 DIAGNOSIS — E038 Other specified hypothyroidism: Secondary | ICD-10-CM | POA: Diagnosis not present

## 2019-08-28 DIAGNOSIS — O26893 Other specified pregnancy related conditions, third trimester: Secondary | ICD-10-CM

## 2019-08-28 DIAGNOSIS — Z8759 Personal history of other complications of pregnancy, childbirth and the puerperium: Secondary | ICD-10-CM

## 2019-08-28 LAB — URINALYSIS, ROUTINE W REFLEX MICROSCOPIC
Bilirubin Urine: NEGATIVE
Glucose, UA: NEGATIVE mg/dL
Hgb urine dipstick: NEGATIVE
Ketones, ur: 20 mg/dL — AB
Nitrite: NEGATIVE
Protein, ur: NEGATIVE mg/dL
Specific Gravity, Urine: 1.013 (ref 1.005–1.030)
pH: 6 (ref 5.0–8.0)

## 2019-08-28 LAB — COMPREHENSIVE METABOLIC PANEL
ALT: 13 U/L (ref 0–44)
AST: 16 U/L (ref 15–41)
Albumin: 2.4 g/dL — ABNORMAL LOW (ref 3.5–5.0)
Alkaline Phosphatase: 100 U/L (ref 38–126)
Anion gap: 10 (ref 5–15)
BUN: 6 mg/dL (ref 6–20)
CO2: 19 mmol/L — ABNORMAL LOW (ref 22–32)
Calcium: 8.5 mg/dL — ABNORMAL LOW (ref 8.9–10.3)
Chloride: 107 mmol/L (ref 98–111)
Creatinine, Ser: 0.62 mg/dL (ref 0.44–1.00)
GFR calc Af Amer: >60 mL/min (ref >60)
GFR calc non Af Amer: >60 mL/min (ref >60)
Glucose, Bld: 87 mg/dL (ref 70–99)
Potassium: 3.4 mmol/L — ABNORMAL LOW (ref 3.5–5.1)
Sodium: 136 mmol/L (ref 135–145)
Total Bilirubin: 0.4 mg/dL (ref 0.3–1.2)
Total Protein: 5.6 g/dL — ABNORMAL LOW (ref 6.5–8.1)

## 2019-08-28 LAB — CBC WITH DIFFERENTIAL/PLATELET
Abs Immature Granulocytes: 0.07 10*3/uL (ref 0.00–0.07)
Basophils Absolute: 0 10*3/uL (ref 0.0–0.1)
Basophils Relative: 0 %
Eosinophils Absolute: 0 10*3/uL (ref 0.0–0.5)
Eosinophils Relative: 0 %
HCT: 30.7 % — ABNORMAL LOW (ref 36.0–46.0)
Hemoglobin: 9.5 g/dL — ABNORMAL LOW (ref 12.0–15.0)
Immature Granulocytes: 1 %
Lymphocytes Relative: 22 %
Lymphs Abs: 1.7 10*3/uL (ref 0.7–4.0)
MCH: 25.4 pg — ABNORMAL LOW (ref 26.0–34.0)
MCHC: 30.9 g/dL (ref 30.0–36.0)
MCV: 82.1 fL (ref 80.0–100.0)
Monocytes Absolute: 0.6 10*3/uL (ref 0.1–1.0)
Monocytes Relative: 8 %
Neutro Abs: 5.1 10*3/uL (ref 1.7–7.7)
Neutrophils Relative %: 69 %
Platelets: 222 10*3/uL (ref 150–400)
RBC: 3.74 MIL/uL — ABNORMAL LOW (ref 3.87–5.11)
RDW: 14.7 % (ref 11.5–15.5)
WBC: 7.5 10*3/uL (ref 4.0–10.5)
nRBC: 0 % (ref 0.0–0.2)

## 2019-08-28 MED ORDER — ALUM & MAG HYDROXIDE-SIMETH 200-200-20 MG/5ML PO SUSP
30.0000 mL | Freq: Once | ORAL | Status: AC
  Administered 2019-08-28: 30 mL via ORAL
  Filled 2019-08-28: qty 30

## 2019-08-28 MED ORDER — LIDOCAINE VISCOUS HCL 2 % MT SOLN
15.0000 mL | Freq: Once | OROMUCOSAL | Status: AC
  Administered 2019-08-28: 15 mL via ORAL
  Filled 2019-08-28: qty 15

## 2019-08-28 MED ORDER — DICYCLOMINE HCL 10 MG/5ML PO SOLN
10.0000 mg | Freq: Once | ORAL | Status: AC
  Administered 2019-08-28: 10 mg via ORAL
  Filled 2019-08-28: qty 5

## 2019-08-28 NOTE — MAU Provider Note (Signed)
History     CSN: [id number]  Arrival date and time: 08/28/19 1216   First Provider Initiated Contact with Patient 08/28/19 1314      Chief Complaint  Patient presents with  . Abdominal Pain   Laura Baxter is a 29 y.o. G3P1011 at [redacted]w[redacted]d who presents today with abdominal pain. She states that the pain started last night as a dull throbbing pain, but is now a constant sharp pain. The pain is located in the LUQ. She denies any contractions, VB or LOF. She reports normal fetal movement. She states, "it is a little less than normal, but I am definitely feeling her move". Next OB appt is 08/31/2019.  Abdominal Pain This is a new problem. The current episode started yesterday. The onset quality is gradual. The problem has been gradually worsening. The pain is located in the LUQ. The pain is at a severity of 8/10. The quality of the pain is sharp. The abdominal pain radiates to the back. Pertinent negatives include no constipation, diarrhea, dysuria, fever, frequency, nausea or vomiting. Nothing aggravates the pain. The pain is relieved by nothing (laying down is better than sitting up. ). She has tried acetaminophen for the symptoms. The treatment provided mild relief.    OB History    Gravida  3   Para  1   Term  1   Preterm      AB  1   Living  1     SAB  1   TAB      Ectopic      Multiple  0   Live Births  1           Past Medical History:  Diagnosis Date  . History of febrile seizure    INFANT  . History of hyperthyroidism    DX 2015--  TSH LEVELS NORMALIZED WITHOUT ANY TREATMENT  . History of hypotension   . History of ovarian cyst   . 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 Iron deficiency anemia   . Pilonidal cyst with abscess   . Wears glasses     Past Surgical History:  Procedure Laterality Date  . PILONIDAL CYST EXCISION N/A 06/17/2017   Procedure: CYST EXCISION PILONIDAL;  Surgeon:  Sheliah Hatch, De Blanch, MD;  Location: [facility];  Service: General;  Laterality: N/A;  . SHOULDER SURGERY Left 2012   capsular repair    Family History  Problem Relation Age of Onset  . Depression Mother   . Anemia Mother   . Heart attack Father   . Hypertension Father   . Hyperlipidemia Father   . Heart disease Father   . Depression Father   . Glaucoma Father   . Graves' disease Father   . Thyroid disease Father   . Cancer Maternal Grandmother        pancreatic  . Breast cancer Paternal Grandmother   . Leukemia Paternal Grandmother   . Leukemia Paternal Grandfather     Social History   Tobacco Use  . Smoking status: Never Smoker  . Smokeless tobacco: Never Used  Vaping Use  . Vaping Use: Never used  Substance Use Topics  . Alcohol use: No  . Drug use: No    Allergies:  Allergies  Allergen Reactions  . Iodine Rash  . Shellfish Allergy Rash  . Latex     Medications Prior to Admission  Medication Sig Dispense Refill Last Dose  . Ferrous  Sulfate (IRON PO) Take by mouth.     Marland Kitchen NIFEdipine (PROCARDIA) 10 MG capsule Take 1 capsule (10 mg total) by mouth every 4 (four) hours as needed for up to 21 days (Contractions, hold for BP 100/60). 60 capsule 0   . ondansetron (ZOFRAN ODT) 4 MG disintegrating tablet Take 1 tablet (4 mg total) by mouth every 8 (eight) hours as needed for nausea or vomiting. 20 tablet 0   . Prenatal-FeFum-FA-DHA w/o A (PRENATAL + DHA PO) Take by mouth.       Review of Systems  Constitutional: Negative for chills and fever.  Gastrointestinal: Positive for abdominal pain. Negative for constipation, diarrhea, nausea and vomiting.  Genitourinary: Negative for dysuria, frequency, pelvic pain, vaginal bleeding and vaginal discharge.   Physical Exam   Blood pressure 104/69, pulse 100, temperature 98.4 F (36.9 C), temperature source Oral, resp. rate 16, height 5\' 4"  (1.626 m), weight 77.6 kg, last menstrual period 08/10/2018, SpO2 100  %.  Physical Exam Vitals and nursing note reviewed.  HENT:     Head: Normocephalic.  Cardiovascular:     Rate and Rhythm: Normal rate.  Pulmonary:     Effort: Pulmonary effort is normal.  Abdominal:     Palpations: Abdomen is soft.     Tenderness: There is no abdominal tenderness.  Skin:    General: Skin is warm and dry.  Neurological:     Mental Status: She is alert and oriented to person, place, and time.  Psychiatric:        Mood and Affect: Mood normal.        Behavior: Behavior normal.      NST:  Baseline: 125 Variability: moderate Accels: 15x15 Decels: none Toco: rare Reactive/Appropriate for GA  Results for orders placed or performed during the hospital encounter of 08/28/19 (from the past 24 hour(s))  Urinalysis, Routine w reflex microscopic     Status: Abnormal   Collection Time: 08/28/19 12:49 PM  Result Value Ref Range   Color, Urine YELLOW YELLOW   APPearance CLEAR CLEAR   Specific Gravity, Urine 1.013 1.005 - 1.030   pH 6.0 5.0 - 8.0   Glucose, UA NEGATIVE NEGATIVE mg/dL   Hgb urine dipstick NEGATIVE NEGATIVE   Bilirubin Urine NEGATIVE NEGATIVE   Ketones, ur 20 (A) NEGATIVE mg/dL   Protein, ur NEGATIVE NEGATIVE mg/dL   Nitrite NEGATIVE NEGATIVE   Leukocytes,Ua MODERATE (A) NEGATIVE   RBC / HPF 0-5 0 - 5 RBC/hpf   WBC, UA 6-10 0 - 5 WBC/hpf   Bacteria, UA RARE (A) NONE SEEN   Squamous Epithelial / LPF 0-5 0 - 5   Mucus PRESENT   CBC with Differential/Platelet     Status: Abnormal   Collection Time: 08/28/19  1:42 PM  Result Value Ref Range   WBC 7.5 4.0 - 10.5 K/uL   RBC 3.74 (L) 3.87 - 5.11 MIL/uL   Hemoglobin 9.5 (L) 12.0 - 15.0 g/dL   HCT 10/29/19 (L) 36 - 46 %   MCV 82.1 80.0 - 100.0 fL   MCH 25.4 (L) 26.0 - 34.0 pg   MCHC 30.9 30.0 - 36.0 g/dL   RDW 60.7 37.1 - 06.2 %   Platelets 222 150 - 400 K/uL   nRBC 0.0 0.0 - 0.2 %   Neutrophils Relative % 69 %   Neutro Abs 5.1 1.7 - 7.7 K/uL   Lymphocytes Relative 22 %   Lymphs Abs 1.7 0.7 - 4.0  K/uL   Monocytes Relative 8 %  Monocytes Absolute 0.6 0 - 1 K/uL   Eosinophils Relative 0 %   Eosinophils Absolute 0.0 0 - 0 K/uL   Basophils Relative 0 %   Basophils Absolute 0.0 0 - 0 K/uL   Immature Granulocytes 1 %   Abs Immature Granulocytes 0.07 0.00 - 0.07 K/uL  Comprehensive metabolic panel     Status: Abnormal   Collection Time: 08/28/19  1:42 PM  Result Value Ref Range   Sodium 136 135 - 145 mmol/L   Potassium 3.4 (L) 3.5 - 5.1 mmol/L   Chloride 107 98 - 111 mmol/L   CO2 19 (L) 22 - 32 mmol/L   Glucose, Bld 87 70 - 99 mg/dL   BUN 6 6 - 20 mg/dL   Creatinine, Ser 5.97 0.44 - 1.00 mg/dL   Calcium 8.5 (L) 8.9 - 10.3 mg/dL   Total Protein 5.6 (L) 6.5 - 8.1 g/dL   Albumin 2.4 (L) 3.5 - 5.0 g/dL   AST 16 15 - 41 U/L   ALT 13 0 - 44 U/L   Alkaline Phosphatase 100 38 - 126 U/L   Total Bilirubin 0.4 0.3 - 1.2 mg/dL   GFR calc non Af Amer >60 >60 mL/min   GFR calc Af Amer >60 >60 mL/min   Anion gap 10 5 - 15   BPP: 6/8, with reactive NST for total of 8/10   MAU Course  Procedures  MDM Patient has had GI cocktail and reports some improvement in her pain 1611: C/W Dr. Adrian Blackwater, patient ok for DC home.   Assessment and Plan   1. Abdominal pain in pregnancy, third trimester   2. Preterm uterine contractions in third trimester, antepartum   3. [redacted] weeks gestation of pregnancy   4. NST (non-stress test) reactive    DC home 3rd Trimester precautions  labor precautions  Fetal kick counts RX: none  Return to MAU as needed FU with OB as planned   Follow-up Information    Gynecology, [facility].   Specialty: Obstetrics and Gynecology Contact information: [address] [place] [address] [phone]               Thressa Sheller DNP, CNM  08/28/19  4:12 PM

## 2019-08-28 NOTE — MAU Note (Signed)
Laura Baxter is a 29 y.o. at [redacted]w[redacted]d here in MAU reporting: was here yesterday for a fall. Since this AM her belly has been hard. Also having a stabbing pain in the middle of her abdomen. No bleeding, no LOF. DFM, has felt some but less than normal.  Onset of complaint: today  Pain score: 8/10  Vitals:   08/28/19 1249  BP: 104/69  Pulse: 100  Resp: 16  Temp: 98.4 F (36.9 C)  SpO2: 100%     FHT: 138  Lab orders placed from triage: UA

## 2019-08-28 NOTE — Discharge Instructions (Signed)
Third Trimester of Pregnancy The third trimester is from week 28 through week 40 (months 7 through 9). The third trimester is a time when the unborn baby (fetus) is growing rapidly. At the end of the ninth month, the fetus is about 20 inches in length and weighs 6-10 pounds. Body changes during your third trimester Your body will continue to go through many changes during pregnancy. The changes vary from woman to woman. During the third trimester:  Your weight will continue to increase. You can expect to gain 25-35 pounds (11-16 kg) by the end of the pregnancy.  You may begin to get stretch marks on your hips, abdomen, and breasts.  You may urinate more often because the fetus is moving lower into your pelvis and pressing on your bladder.  You may develop or continue to have heartburn. This is caused by increased hormones that slow down muscles in the digestive tract.  You may develop or continue to have constipation because increased hormones slow digestion and cause the muscles that push waste through your intestines to relax.  You may develop hemorrhoids. These are swollen veins (varicose veins) in the rectum that can itch or be painful.  You may develop swollen, bulging veins (varicose veins) in your legs.  You may have increased body aches in the pelvis, back, or thighs. This is due to weight gain and increased hormones that are relaxing your joints.  You may have changes in your hair. These can include thickening of your hair, rapid growth, and changes in texture. Some women also have hair loss during or after pregnancy, or hair that feels dry or thin. Your hair will most likely return to normal after your baby is born.  Your breasts will continue to grow and they will continue to become tender. A yellow fluid (colostrum) may leak from your breasts. This is the first milk you are producing for your baby.  Your belly button may stick out.  You may notice more swelling in your hands,  face, or ankles.  You may have increased tingling or numbness in your hands, arms, and legs. The skin on your belly may also feel numb.  You may feel short of breath because of your expanding uterus.  You may have more problems sleeping. This can be caused by the size of your belly, increased need to urinate, and an increase in your body's metabolism.  You may notice the fetus "dropping," or moving lower in your abdomen (lightening).  You may have increased vaginal discharge.  You may notice your joints feel loose and you may have pain around your pelvic bone. What to expect at prenatal visits You will have prenatal exams every 2 weeks until week 36. Then you will have weekly prenatal exams. During a routine prenatal visit:  You will be weighed to make sure you and the baby are growing normally.  Your blood pressure will be taken.  Your abdomen will be measured to track your baby's growth.  The fetal heartbeat will be listened to.  Any test results from the previous visit will be discussed.  You may have a cervical check near your due date to see if your cervix has softened or thinned (effaced).  You will be tested for Group B streptococcus. This happens between 35 and 37 weeks. Your health care provider may ask you:  What your birth plan is.  How you are feeling.  If you are feeling the baby move.  If you have had any abnormal   symptoms, such as leaking fluid, bleeding, severe headaches, or abdominal cramping.  If you are using any tobacco products, including cigarettes, chewing tobacco, and electronic cigarettes.  If you have any questions. Other tests or screenings that may be performed during your third trimester include:  Blood tests that check for low iron levels (anemia).  Fetal testing to check the health, activity level, and growth of the fetus. Testing is done if you have certain medical conditions or if there are problems during the pregnancy.  Nonstress test  (NST). This test checks the health of your baby to make sure there are no signs of problems, such as the baby not getting enough oxygen. During this test, a belt is placed around your belly. The baby is made to move, and its heart rate is monitored during movement. What is false labor? False labor is a condition in which you feel small, irregular tightenings of the muscles in the womb (contractions) that usually go away with rest, changing position, or drinking water. These are called Braxton Hicks contractions. Contractions may last for hours, days, or even weeks before true labor sets in. If contractions come at regular intervals, become more frequent, increase in intensity, or become painful, you should see your health care provider. What are the signs of labor?  Abdominal cramps.  Regular contractions that start at 10 minutes apart and become stronger and more frequent with time.  Contractions that start on the top of the uterus and spread down to the lower abdomen and back.  Increased pelvic pressure and dull back pain.  A watery or bloody mucus discharge that comes from the vagina.  Leaking of amniotic fluid. This is also known as your "water breaking." It could be a slow trickle or a gush. Let your health care provider know if it has a color or strange odor. If you have any of these signs, call your health care provider right away, even if it is before your due date. Follow these instructions at home: Medicines  Follow your health care provider's instructions regarding medicine use. Specific medicines may be either safe or unsafe to take during pregnancy.  Take a prenatal vitamin that contains at least 600 micrograms (mcg) of folic acid.  If you develop constipation, try taking a stool softener if your health care provider approves. Eating and drinking   Eat a balanced diet that includes fresh fruits and vegetables, whole grains, good sources of protein such as meat, eggs, or tofu,  and low-fat dairy. Your health care provider will help you determine the amount of weight gain that is right for you.  Avoid raw meat and uncooked cheese. These carry germs that can cause birth defects in the baby.  If you have low calcium intake from food, talk to your health care provider about whether you should take a daily calcium supplement.  Eat four or five small meals rather than three large meals a day.  Limit foods that are high in fat and processed sugars, such as fried and sweet foods.  To prevent constipation: ? Drink enough fluid to keep your urine clear or pale yellow. ? Eat foods that are high in fiber, such as fresh fruits and vegetables, whole grains, and beans. Activity  Exercise only as directed by your health care provider. Most women can continue their usual exercise routine during pregnancy. Try to exercise for 30 minutes at least 5 days a week. Stop exercising if you experience uterine contractions.  Avoid heavy lifting.  Do   not exercise in extreme heat or humidity, or at high altitudes.  Wear low-heel, comfortable shoes.  Practice good posture.  You may continue to have sex unless your health care provider tells you otherwise. Relieving pain and discomfort  Take frequent breaks and rest with your legs elevated if you have leg cramps or low back pain.  Take warm sitz baths to soothe any pain or discomfort caused by hemorrhoids. Use hemorrhoid cream if your health care provider approves.  Wear a good support bra to prevent discomfort from breast tenderness.  If you develop varicose veins: ? Wear support pantyhose or compression stockings as told by your healthcare provider. ? Elevate your feet for 15 minutes, 3-4 times a day. Prenatal care  Write down your questions. Take them to your prenatal visits.  Keep all your prenatal visits as told by your health care provider. This is important. Safety  Wear your seat belt at all times when driving.  Make  a list of emergency phone numbers, including numbers for family, friends, the hospital, and police and fire departments. General instructions  Avoid cat litter boxes and soil used by cats. These carry germs that can cause birth defects in the baby. If you have a cat, ask someone to clean the litter box for you.  Do not travel far distances unless it is absolutely necessary and only with the approval of your health care provider.  Do not use hot tubs, steam rooms, or saunas.  Do not drink alcohol.  Do not use any products that contain nicotine or tobacco, such as cigarettes and e-cigarettes. If you need help quitting, ask your health care provider.  Do not use any medicinal herbs or unprescribed drugs. These chemicals affect the formation and growth of the baby.  Do not douche or use tampons or scented sanitary pads.  Do not cross your legs for long periods of time.  To prepare for the arrival of your baby: ? Take prenatal classes to understand, practice, and ask questions about labor and delivery. ? Make a trial run to the hospital. ? Visit the hospital and tour the maternity area. ? Arrange for maternity or paternity leave through employers. ? Arrange for family and friends to take care of pets while you are in the hospital. ? Purchase a rear-facing car seat and make sure you know how to install it in your car. ? Pack your hospital bag. ? Prepare the baby's nursery. Make sure to remove all pillows and stuffed animals from the baby's crib to prevent suffocation.  Visit your dentist if you have not gone during your pregnancy. Use a soft toothbrush to brush your teeth and be gentle when you floss. Contact a health care provider if:  You are unsure if you are in labor or if your water has broken.  You become dizzy.  You have mild pelvic cramps, pelvic pressure, or nagging pain in your abdominal area.  You have lower back pain.  You have persistent nausea, vomiting, or  diarrhea.  You have an unusual or bad smelling vaginal discharge.  You have pain when you urinate. Get help right away if:  Your water breaks before 37 weeks.  You have regular contractions less than 5 minutes apart before 37 weeks.  You have a fever.  You are leaking fluid from your vagina.  You have spotting or bleeding from your vagina.  You have severe abdominal pain or cramping.  You have rapid weight loss or weight gain.  You have   shortness of breath with chest pain.  You notice sudden or extreme swelling of your face, hands, ankles, feet, or legs.  Your baby makes fewer than 10 movements in 2 hours.  You have severe headaches that do not go away when you take medicine.  You have vision changes. Summary  The third trimester is from week 28 through week 40, months 7 through 9. The third trimester is a time when the unborn baby (fetus) is growing rapidly.  During the third trimester, your discomfort may increase as you and your baby continue to gain weight. You may have abdominal, leg, and back pain, sleeping problems, and an increased need to urinate.  During the third trimester your breasts will keep growing and they will continue to become tender. A yellow fluid (colostrum) may leak from your breasts. This is the first milk you are producing for your baby.  False labor is a condition in which you feel small, irregular tightenings of the muscles in the womb (contractions) that eventually go away. These are called Braxton Hicks contractions. Contractions may last for hours, days, or even weeks before true labor sets in.  Signs of labor can include: abdominal cramps; regular contractions that start at 10 minutes apart and become stronger and more frequent with time; watery or bloody mucus discharge that comes from the vagina; increased pelvic pressure and dull back pain; and leaking of amniotic fluid. This information is not intended to replace advice given to you by your  health care provider. Make sure you discuss any questions you have with your health care provider. Document Revised: 06/03/2018 Document Reviewed: 03/18/2016 Elsevier Patient Education  2020 [employer]  

## 2019-08-29 LAB — CULTURE, OB URINE: Culture: NO GROWTH

## 2019-08-30 DIAGNOSIS — F4322 Adjustment disorder with anxiety: Secondary | ICD-10-CM | POA: Diagnosis not present

## 2019-08-31 DIAGNOSIS — O403XX0 Polyhydramnios, third trimester, not applicable or unspecified: Secondary | ICD-10-CM | POA: Diagnosis not present

## 2019-08-31 DIAGNOSIS — Z3A38 38 weeks gestation of pregnancy: Secondary | ICD-10-CM | POA: Diagnosis not present

## 2019-09-02 ENCOUNTER — Inpatient Hospital Stay (HOSPITAL_COMMUNITY): Payer: BC Managed Care – PPO | Admitting: Anesthesiology

## 2019-09-02 NOTE — Anesthesia Preprocedure Evaluation (Deleted)
Anesthesia Evaluation  Patient identified by MRN, date of birth, ID band Patient awake    Reviewed: Allergy & Precautions, NPO status , Patient's Chart, lab work & pertinent test results  History of Anesthesia Complications Negative for: history of anesthetic complications  Airway Mallampati: II  TM Distance: >3 FB Neck ROM: Full    Dental  (+) Dental Advisory Given, Teeth Intact   Pulmonary neg pulmonary ROS,    Pulmonary exam normal        Cardiovascular negative cardio ROS Normal cardiovascular exam     Neuro/Psych Seizures - (febrile, as an infant),  negative psych ROS   GI/Hepatic negative GI ROS, Neg liver ROS,   Endo/Other  Hypothyroidism   Renal/GU negative Renal ROS     Musculoskeletal negative musculoskeletal ROS (+)   Abdominal   Peds  Hematology  (+) anemia ,  Hgb 9.5, Plt 222   Anesthesia Other Findings Covid test negative   Reproductive/Obstetrics (+) Pregnancy (hx of shoulder dystocia with previous delivery)                           Anesthesia Physical Anesthesia Plan  ASA: II  Anesthesia Plan: Spinal   Post-op Pain Management:    Induction:   PONV Risk Score and Plan: 4 or greater and Treatment may vary due to age or medical condition, Ondansetron, Dexamethasone and Scopolamine patch - Pre-op  Airway Management Planned: Natural Airway  Additional Equipment: None  Intra-op Plan:   Post-operative Plan:   Informed Consent: I have reviewed the patients History and Physical, chart, labs and discussed the procedure including the risks, benefits and alternatives for the proposed anesthesia with the patient or authorized representative who has indicated his/her understanding and acceptance.       Plan Discussed with: CRNA and Anesthesiologist  Anesthesia Plan Comments: (Labs reviewed, platelets acceptable. Discussed risks and benefits of spinal, including  spinal/epidural hematoma, infection, failed block, and PDPH. Patient expressed understanding and wished to proceed. )      Anesthesia Quick Evaluation

## 2019-09-05 ENCOUNTER — Other Ambulatory Visit (HOSPITAL_COMMUNITY)
Admission: RE | Admit: 2019-09-05 | Discharge: 2019-09-05 | Disposition: A | Discharge status: Home / Self Care | Payer: BC Managed Care – PPO | Source: Ambulatory Visit | Attending: Obstetrics and Gynecology | Admitting: Obstetrics and Gynecology

## 2019-09-05 ENCOUNTER — Encounter (HOSPITAL_COMMUNITY): Payer: Self-pay

## 2019-09-05 DIAGNOSIS — D509 Iron deficiency anemia, unspecified: Secondary | ICD-10-CM | POA: Diagnosis not present

## 2019-09-05 DIAGNOSIS — O99284 Endocrine, nutritional and metabolic diseases complicating childbirth: Secondary | ICD-10-CM | POA: Diagnosis not present

## 2019-09-05 DIAGNOSIS — E039 Hypothyroidism, unspecified: Secondary | ICD-10-CM | POA: Diagnosis not present

## 2019-09-05 DIAGNOSIS — Z3A38 38 weeks gestation of pregnancy: Secondary | ICD-10-CM | POA: Diagnosis not present

## 2019-09-05 DIAGNOSIS — Z01812 Encounter for preprocedural laboratory examination: Secondary | ICD-10-CM | POA: Insufficient documentation

## 2019-09-05 DIAGNOSIS — O4292 Full-term premature rupture of membranes, unspecified as to length of time between rupture and onset of labor: Secondary | ICD-10-CM | POA: Diagnosis not present

## 2019-09-05 DIAGNOSIS — O9902 Anemia complicating childbirth: Secondary | ICD-10-CM | POA: Diagnosis not present

## 2019-09-05 DIAGNOSIS — Z20822 Contact with and (suspected) exposure to covid-19: Secondary | ICD-10-CM | POA: Diagnosis not present

## 2019-09-05 DIAGNOSIS — Z23 Encounter for immunization: Secondary | ICD-10-CM | POA: Diagnosis not present

## 2019-09-05 DIAGNOSIS — O26893 Other specified pregnancy related conditions, third trimester: Secondary | ICD-10-CM | POA: Diagnosis not present

## 2019-09-05 DIAGNOSIS — O403XX0 Polyhydramnios, third trimester, not applicable or unspecified: Secondary | ICD-10-CM | POA: Diagnosis not present

## 2019-09-05 LAB — SARS CORONAVIRUS 2 (TAT 6-24 HRS): SARS Coronavirus 2: NEGATIVE

## 2019-09-05 NOTE — Patient Instructions (Signed)
Laura Baxter  09/05/2019   Your procedure is scheduled on:  09/07/2019  Arrive at 0845 at Entrance C on [employer] at [facility]  and [employer]. You are invited to use the FREE valet parking or use the Visitor's parking deck.  Pick up the phone at the desk and dial [phone].  Call this number if you have problems the morning of surgery: [phone]  Remember:   Do not eat food:(After Midnight) Desps de medianoche.  Do not drink clear liquids: (After Midnight) Desps de medianoche.  Take these medicines the morning of surgery with A SIP OF WATER:  Take your levothyroxine and prozac as prescribed   Do not wear jewelry, make-up or nail polish.  Do not wear lotions, powders, or perfumes. Do not wear deodorant.  Do not shave 48 hours prior to surgery.  Do not bring valuables to the hospital.  [facility] is not   responsible for any belongings or valuables brought to the hospital.  Contacts, dentures or bridgework may not be worn into surgery.  Leave suitcase in the car. After surgery it may be brought to your room.  For patients admitted to the hospital, checkout time is 11:00 AM the day of              discharge.      Please read over the following fact sheets that you were given:     Preparing for Surgery

## 2019-09-06 ENCOUNTER — Inpatient Hospital Stay (HOSPITAL_COMMUNITY)
Admission: RE | Admit: 2019-09-06 | Discharge: 2019-09-06 | Disposition: A | Discharge status: Home / Self Care | Payer: BC Managed Care – PPO | Source: Ambulatory Visit

## 2019-09-06 ENCOUNTER — Other Ambulatory Visit: Payer: Self-pay

## 2019-09-06 ENCOUNTER — Encounter (HOSPITAL_COMMUNITY): Payer: Self-pay | Admitting: Obstetrics & Gynecology

## 2019-09-06 ENCOUNTER — Encounter (HOSPITAL_COMMUNITY)
Admission: AD | Disposition: A | Discharge status: Home / Self Care | Payer: Self-pay | Source: Home / Self Care | Attending: Obstetrics & Gynecology

## 2019-09-06 ENCOUNTER — Inpatient Hospital Stay (HOSPITAL_COMMUNITY)
Admission: AD | Admit: 2019-09-06 | Discharge: 2019-09-08 | DRG: 807 | Disposition: A | Discharge status: Home / Self Care | Payer: BC Managed Care – PPO | Attending: Obstetrics & Gynecology | Admitting: Obstetrics & Gynecology

## 2019-09-06 DIAGNOSIS — O403XX0 Polyhydramnios, third trimester, not applicable or unspecified: Secondary | ICD-10-CM | POA: Diagnosis present

## 2019-09-06 DIAGNOSIS — O4292 Full-term premature rupture of membranes, unspecified as to length of time between rupture and onset of labor: Secondary | ICD-10-CM | POA: Diagnosis present

## 2019-09-06 DIAGNOSIS — E039 Hypothyroidism, unspecified: Secondary | ICD-10-CM | POA: Diagnosis present

## 2019-09-06 DIAGNOSIS — O9902 Anemia complicating childbirth: Secondary | ICD-10-CM | POA: Diagnosis present

## 2019-09-06 DIAGNOSIS — Z3A38 38 weeks gestation of pregnancy: Secondary | ICD-10-CM

## 2019-09-06 DIAGNOSIS — Z20822 Contact with and (suspected) exposure to covid-19: Secondary | ICD-10-CM | POA: Diagnosis present

## 2019-09-06 DIAGNOSIS — O26893 Other specified pregnancy related conditions, third trimester: Secondary | ICD-10-CM | POA: Diagnosis present

## 2019-09-06 DIAGNOSIS — D509 Iron deficiency anemia, unspecified: Secondary | ICD-10-CM | POA: Diagnosis present

## 2019-09-06 DIAGNOSIS — O99284 Endocrine, nutritional and metabolic diseases complicating childbirth: Secondary | ICD-10-CM | POA: Diagnosis present

## 2019-09-06 LAB — TYPE AND SCREEN
ABO/RH(D): B POS
Antibody Screen: NEGATIVE

## 2019-09-06 LAB — CBC
HCT: 33.1 % — ABNORMAL LOW (ref 36.0–46.0)
Hemoglobin: 10.4 g/dL — ABNORMAL LOW (ref 12.0–15.0)
MCH: 25.8 pg — ABNORMAL LOW (ref 26.0–34.0)
MCHC: 31.4 g/dL (ref 30.0–36.0)
MCV: 82.1 fL (ref 80.0–100.0)
Platelets: 211 10*3/uL (ref 150–400)
RBC: 4.03 MIL/uL (ref 3.87–5.11)
RDW: 14.7 % (ref 11.5–15.5)
WBC: 7.4 10*3/uL (ref 4.0–10.5)
nRBC: 0 % (ref 0.0–0.2)

## 2019-09-06 LAB — POCT FERN TEST: POCT Fern Test: POSITIVE

## 2019-09-06 LAB — RPR: RPR Ser Ql: NONREACTIVE

## 2019-09-06 SURGERY — Surgical Case
Anesthesia: Spinal

## 2019-09-06 MED ORDER — SOD CITRATE-CITRIC ACID 500-334 MG/5ML PO SOLN: 30.0000 mL | ORAL | Status: DC | PRN

## 2019-09-06 MED ORDER — ACETAMINOPHEN 500 MG PO TABS: 1000.0000 mg | ORAL_TABLET | Freq: Four times a day (QID) | ORAL | Status: DC | PRN

## 2019-09-06 MED ORDER — CEFAZOLIN SODIUM-DEXTROSE 2-4 GM/100ML-% IV SOLN
2.0000 g | INTRAVENOUS | Status: DC
  Filled 2019-09-06: qty 100

## 2019-09-06 MED ORDER — SOD CITRATE-CITRIC ACID 500-334 MG/5ML PO SOLN
30.0000 mL | Freq: Once | ORAL | Status: DC
  Filled 2019-09-06: qty 30

## 2019-09-06 MED ORDER — LACTATED RINGERS IV SOLN
500.0000 mL | INTRAVENOUS | Status: DC | PRN
  Administered 2019-09-06: 500 mL via INTRAVENOUS

## 2019-09-06 MED ORDER — LACTATED RINGERS IV SOLN: INTRAVENOUS | Status: DC

## 2019-09-06 MED ORDER — ONDANSETRON HCL 4 MG/2ML IJ SOLN
4.0000 mg | Freq: Four times a day (QID) | INTRAMUSCULAR | Status: DC | PRN
  Administered 2019-09-06: 4 mg via INTRAVENOUS
  Filled 2019-09-06: qty 2

## 2019-09-06 MED ORDER — FAMOTIDINE IN NACL 20-0.9 MG/50ML-% IV SOLN
20.0000 mg | Freq: Once | INTRAVENOUS | Status: DC
  Filled 2019-09-06: qty 50

## 2019-09-06 MED ORDER — SODIUM CHLORIDE 0.9% FLUSH: 3.0000 mL | INTRAVENOUS | Status: DC | PRN

## 2019-09-06 MED ORDER — TERBUTALINE SULFATE 1 MG/ML IJ SOLN: 0.2500 mg | Freq: Once | INTRAMUSCULAR | Status: DC | PRN

## 2019-09-06 MED ORDER — FENTANYL CITRATE (PF) 100 MCG/2ML IJ SOLN
50.0000 ug | INTRAMUSCULAR | Status: DC | PRN
  Administered 2019-09-06: 100 ug via INTRAVENOUS
  Filled 2019-09-06: qty 2

## 2019-09-06 MED ORDER — SODIUM CHLORIDE 0.9 % IV SOLN: 250.0000 mL | INTRAVENOUS | Status: DC | PRN

## 2019-09-06 MED ORDER — OXYTOCIN BOLUS FROM INFUSION
333.0000 mL | Freq: Once | INTRAVENOUS | Status: AC
  Administered 2019-09-06: 333 mL via INTRAVENOUS

## 2019-09-06 MED ORDER — LACTATED RINGERS IV BOLUS: 1000.0000 mL | Freq: Once | INTRAVENOUS | Status: DC

## 2019-09-06 MED ORDER — OXYTOCIN-SODIUM CHLORIDE 30-0.9 UT/500ML-% IV SOLN
1.0000 m[IU]/min | INTRAVENOUS | Status: DC
  Administered 2019-09-06: 2 m[IU]/min via INTRAVENOUS
  Filled 2019-09-06: qty 500

## 2019-09-06 MED ORDER — OXYTOCIN 10 UNIT/ML IJ SOLN: 10.0000 [IU] | Freq: Once | INTRAMUSCULAR | Status: DC

## 2019-09-06 MED ORDER — LIDOCAINE HCL (PF) 1 % IJ SOLN: 30.0000 mL | INTRAMUSCULAR | Status: DC | PRN

## 2019-09-06 MED ORDER — OXYTOCIN-SODIUM CHLORIDE 30-0.9 UT/500ML-% IV SOLN
2.5000 [IU]/h | INTRAVENOUS | Status: DC
  Administered 2019-09-06: 2.5 [IU]/h via INTRAVENOUS

## 2019-09-06 MED ORDER — SODIUM CHLORIDE 0.9% FLUSH: 3.0000 mL | Freq: Two times a day (BID) | INTRAVENOUS | Status: DC

## 2019-09-06 NOTE — MAU Note (Signed)
.   Laura Baxter is a 29 y.o. at [redacted]w[redacted]d here in MAU reporting: she thinks her water at 530 this morning. Denies any pain. +FM  Onset of complaint: 0530 Pain score: 0 Vitals:   09/06/19 0741  BP: 116/70  Pulse: 69  Resp: 18  Temp: 97.7 F (36.5 C)  SpO2: 99%     FHT:150 Lab orders placed from triage:

## 2019-09-06 NOTE — Progress Notes (Signed)
S: Doing well, resting through contractions. Not feeling many contractions. Partner at the bedside providing support.   O: Vitals:   09/06/19 1507 09/06/19 1537 09/06/19 1638 09/06/19 1700  BP: 112/68 113/70 (!) 100/53 (!) 96/54  Pulse: 74 67 67 69  Resp:      Temp:      TempSrc:      SpO2:       FHT:  FHR: 130 bpm, variability: moderate,  accelerations:  Present,  decelerations:  Absent UC:   regular, every 3-4 minutes SVE:   Dilation: 3.5 Effacement (%): 70 Station: -2 Exam by:: Dorisann Frames, CNM   A / P: [redacted]w[redacted]d, SROM at 0530, GBS negative  Fetal Wellbeing:  Category I Pain Control:  Labor support without medications Anticipated MOD:  NSVD   IUPC placed without difficulty. Will continue to titrate Pitocin until adequate contractions.   June Leap, CNM, MSN 09/06/2019, 5:55 PM

## 2019-09-06 NOTE — H&P (Signed)
OB ADMISSION/ HISTORY & PHYSICAL:  Admission Date: 09/06/2019  7:28 AM  Admit Diagnosis: Normal labor [O80, Z37.9]    Laura Baxter is a 29 y.o. female presenting for spontaneous rupture of membranes. Pt states she had a moderate trickle of clear fluid at 0530 this AM. Reports occasional contractions. Denies vaginal bleeding. Endorses + fetal movement. Was scheduled for a primary C-section tomorrow for a history of shoulder dystocia. Pt now desires to labor. Partner at the bedside providing support. Eagerly anticipating the arrival of baby girl, Laura Baxter.   Prenatal History: G3P1011   EDC : 09/14/2019, by Other Basis  Prenatal care at [facility] since 12 weeks   Prenatal course complicated by: 1. Hypothyroidism - on levothyroxine, followed by endocrinology, hx Graves' dx, with fluctuating TSH levels. 2. Anemia of pregnancy - Hgb 10.6 at glucola 3. Polyhydramnios  4. History of Shoulder girdle dystocia  - No negative outcome, 7+8 baby. RELIEVED WITH MCROBERTS AND SUPRAPUBIC PRESSURE.   Prenatal Labs: ABO, Rh:   B POS Antibody: NEG (07/13 0817) Rubella:   Immune RPR:   Non-reactive HBsAg:   Negative HIV:   Negative GBS: NEGATIVE/-- (05/31 1407)  1 hr Glucola : 80 Genetic Screening: Panorama low risk Ultrasound: Growth 08/25/2019 Singleton pregnancy. Vertex. Posterior Placenta. Cervix not seen per protocol. Amniotic fluid appears increased--Mild Polyhydramnios. AFI=26.6cm EFW=2970g, 6 pounds 9oz., 41% BPP 8/8 in 10 min Bilateral Adnexas appear unremarkable    Maternal Diabetes: No Genetic Screening: Normal Maternal Ultrasounds/Referrals: Normal Fetal Ultrasounds or other Referrals:  None Maternal Substance Abuse:  No Significant Maternal Medications:  Meds include: Syntroid Significant Maternal Lab Results:  None Other Comments:  None  Medical / Surgical History :  Past medical history:  Past Medical History:  Diagnosis Date  . History of febrile seizure    INFANT  .  History of hyperthyroidism    DX 2015--  TSH LEVELS NORMALIZED WITHOUT ANY TREATMENT  . History of hypotension   . History of ovarian cyst   . 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 Iron deficiency anemia   . Pilonidal cyst with abscess   . Wears glasses      Past surgical history:  Past Surgical History:  Procedure Laterality Date  . PILONIDAL CYST EXCISION N/A 06/17/2017   Procedure: CYST EXCISION PILONIDAL;  Surgeon: Sheliah Hatch, De Blanch, MD;  Location: [facility];  Service: General;  Laterality: N/A;  . SHOULDER SURGERY Left 2012   capsular repair     Family History:  Family History  Problem Relation Age of Onset  . Depression Mother   . Anemia Mother   . Heart attack Father   . Hypertension Father   . Hyperlipidemia Father   . Heart disease Father   . Depression Father   . Glaucoma Father   . Graves' disease Father   . Thyroid disease Father   . Osteoporosis Father   . Cancer Maternal Grandmother        pancreatic  . Breast cancer Paternal Grandmother   . Leukemia Paternal Grandmother   . Leukemia Paternal Grandfather      Social History:  reports that she has never smoked. She has never used smokeless tobacco. She reports that she does not drink alcohol and does not use drugs.   Allergies: Iodine, Shellfish allergy, and Latex   Current Medications at time of admission:  Medications Prior to Admission  Medication Sig Dispense Refill Last Dose  . diphenhydramine-acetaminophen (  TYLENOL PM) 25-500 MG TABS tablet Take 2 tablets by mouth at bedtime as needed (sleep).   09/05/2019 at Unknown time  . FLUoxetine (PROZAC) 20 MG capsule Take 20 mg by mouth daily.   09/05/2019 at Unknown time  . levothyroxine (SYNTHROID) 100 MCG tablet Take 100 mcg by mouth daily before breakfast.   09/05/2019 at Unknown time  . Prenatal-FeFum-FA-DHA w/o A (PRENATAL + DHA PO) Take 1 tablet by mouth daily.     09/05/2019 at Unknown time  . NIFEdipine (PROCARDIA) 10 MG capsule Take 1 capsule (10 mg total) by mouth every 4 (four) hours as needed for up to 21 days (Contractions, hold for BP 100/60). 60 capsule 0     Review of Systems: Review of Systems  Constitutional: Negative for chills and fever.  HENT: Negative for congestion and sore throat.   Eyes: Negative for blurred vision and photophobia.  Respiratory: Negative for cough and shortness of breath.   Cardiovascular: Negative for chest pain and leg swelling.  Gastrointestinal: Positive for heartburn. Negative for nausea and vomiting.  Musculoskeletal: Negative for back pain and falls.  Neurological: Negative for dizziness and headaches.  Psychiatric/Behavioral: Negative for depression. The patient is not nervous/anxious.    Physical Exam: Vital signs and nursing notes reviewed.  Patient Vitals for the past 24 hrs:  BP Temp Pulse Resp SpO2  09/06/19 0741 116/70 97.7 F (36.5 C) 69 18 99 %    General: AAO x 3, NAD Heart: RRR Lungs:CTAB Abdomen: Gravid, NT, Leopold's 7lbs Extremities: no edema Genitalia / VE: Dilation: 3.5 Exam by:: dr pinn   FHR: 140BPM, mod variability, + accels, no decels TOCO: Ctx occasional  Labs:   Pending T&S, CBC, RPR  Recent Labs    09/06/19 0808  WBC 7.4  HGB 10.4*  HCT 33.1*  PLT 211   Assessment:  29 y.o. G3P1011 at [redacted]w[redacted]d, SROM of clear fluid at 0530, history of shoulder dystocia, hypothyroidism in levothyroxine, GBS negative  1. Prelabor rupture of membranes 2. FHR category 1 3. GBS negative 4. Desires natural childbirth 5. Plans to breastfeed 6. Placenta disposal L&D  Plan:  1. Admit to BS 2. Routine L&D orders 3. Analgesia/anesthesia PRN  4. Will start Pitocin 2x2 until adequate contractions 5. Anticipate NSVB 6. Dr. Mora Appl will attend delivery with CNM for history of shoulder dystocia  Dr. Mora Appl notified of admission/plan of care.  June Leap CNM, MSN 09/06/2019, 9:20 AM

## 2019-09-06 NOTE — Progress Notes (Signed)
S: Breathing through contractions and feeling more rectal pressure. Desires IV pain medication.   O: Vitals:   09/06/19 2101 09/06/19 2102 09/06/19 2106 09/06/19 2111  BP:  116/64    Pulse:  72    Resp:  18    Temp:      TempSrc:      SpO2: 96%  97% 97%   FHT:  FHR: 135 bpm, variability: moderate,  accelerations:  Present,  decelerations:  Present Variable UC:   regular, every 2.5-3 minutes SVE:   Dilation: 5.5 Effacement (%): 80 Station: -1 Exam by:: Andrell Tallman MD  A / P: Augmentation of labor, progressing well  Fetal Wellbeing:  Category I Pain Control:  Labor support without medications Anticipated MOD:  NSVD   Will give IV Fentanyl now. Anticipate vaginal delivery.   June Leap, CNM, MSN 09/06/2019, 9:52 PM

## 2019-09-06 NOTE — H&P (Signed)
Laura Baxter is a 29 y.o. female G3P1 at 94 weeks 6 days presenting for rupture of membranes at 05:30.  Patient was scheduled for primary cesarean section for tomorrow for history of shoulder dystocia with first vaginal delivery. Patient reports occasional ctx, no vaginal bleeding and had normal fetal movement. Patient now wants to have a trial for vaginal delivery  OB History     Gravida  3   Para  1   Term  1   Preterm      AB  1   Living  1      SAB  1   TAB      Ectopic      Multiple  0   Live Births  1          Past Medical History:  Diagnosis Date   History of febrile seizure    INFANT   History of hyperthyroidism    DX 2015--  TSH LEVELS NORMALIZED WITHOUT ANY TREATMENT   History of hypotension    History of ovarian cyst    Hypothyroidism, secondary ENDOCRINOLOGIST-  DR Everardo All   SECONDARY TO LYMPHOCYTIC THYROIDITIS   Iron deficiency anemia    Pilonidal cyst with abscess    Wears glasses    Past Surgical History:  Procedure Laterality Date   PILONIDAL CYST EXCISION N/A 06/17/2017   Procedure: CYST EXCISION PILONIDAL;  Surgeon: Rodman Pickle, MD;  Location: [facility];  Service: General;  Laterality: N/A;   SHOULDER SURGERY Left 2012   capsular repair   Family History: family history includes Anemia in her mother; Breast cancer in her paternal grandmother; Cancer in her maternal grandmother; Depression in her father and mother; Glaucoma in her father; Luiz Blare' disease in her father; Heart attack in her father; Heart disease in her father; Hyperlipidemia in her father; Hypertension in her father; Leukemia in her paternal grandfather and paternal grandmother; Osteoporosis in her father; Thyroid disease in her father. Social History:  reports that she has never smoked. She has never used smokeless tobacco. She reports that she does not drink alcohol and does not use drugs.     Maternal Diabetes: No Genetic Screening:  Normal Maternal Ultrasounds/Referrals: Normal Fetal Ultrasounds or other Referrals:  None Maternal Substance Abuse:  No Significant Maternal Medications:  Meds include: Syntroid Significant Maternal Lab Results:  Group B Strep negative Other Comments:  None  Review of Systems as PER HPI History   Blood pressure 116/70, pulse 69, temperature 97.7 F (36.5 C), resp. rate 18, last menstrual period 08/10/2018, SpO2 99 %. Exam Physical Exam  General: AAOx3 NAD ABD: Gravid Cervix: Clear fluid; 3.5/80/-3  Prenatal labs: ABO, Rh: --/--/PENDING (07/13 1610) Antibody: PENDING (07/13 0817) Rubella:  Immune RPR:   NR HBsAg:   NR HIV:   NEG GBS: NEGATIVE/-- (05/31 1407)   Assessment/Plan: 29 year old G3P1 at 38 weeks 6 days SROM in early labor Admit to L&D IV hydration Pitocin for augmentation Discussed at length with patient and spouse about risk of should dystocia, recommendation for epidural in case primary cesarean section will occur.  We reviewed also the risks of possible cesarean section.    Laura Baxter 09/06/2019, 8:45 AM

## 2019-09-06 NOTE — MAU Provider Note (Signed)
S: Ms. Laura Baxter is a 29 y.o. G3P1011 at [redacted]w[redacted]d  who presents to MAU today complaining of leaking of fluid since 0530 today. She denies vaginal bleeding. She denies contractions. She reports normal fetal movement.    Patient receives care with [facility] and has a repeat cesarean scheduled for tomorrow. Most recent meal at 1830 last night with small sips of water this morning.  O: BP 116/70   Pulse 69   Temp 97.7 F (36.5 C)   Resp 18   LMP 08/10/2018   SpO2 99%  GENERAL: Well-developed, well-nourished female in no acute distress.  HEAD: Normocephalic, atraumatic.  CHEST: Normal effort of breathing, regular heart rate ABDOMEN: Soft, nontender, gravid PELVIC: Normal external female genitalia. Vagina is pink and rugated. Cervix with normal contour, no lesions. Normal discharge.  Scant pooling.   Cervical exam: Deferred due to pain score 0/10, previously 1.5   Fetal Monitoring: Baseline: 135 Variability: Mod Accelerations: POS 15 x 15 Decelerations: None Contractions: Occasional, not felt by patient  Results for orders placed or performed during the hospital encounter of 09/05/19 (from the past 24 hour(s))  SARS CORONAVIRUS 2 (TAT 6-24 HRS) Nasopharyngeal Nasopharyngeal Swab     Status: None   Collection Time: 09/05/19 12:31 PM   Specimen: Nasopharyngeal Swab  Result Value Ref Range   SARS Coronavirus 2 NEGATIVE NEGATIVE     A: SIUP at [redacted]w[redacted]d  SROM 0530 today Scant pooling Positive fern  P: Prep for eRLTCS, plan for 1030 start per OR team  Clayton Bibles, CNM 09/06/2019 9:18 AM

## 2019-09-07 ENCOUNTER — Inpatient Hospital Stay (HOSPITAL_COMMUNITY): Payer: BC Managed Care – PPO

## 2019-09-07 ENCOUNTER — Inpatient Hospital Stay (HOSPITAL_COMMUNITY)
Admission: AD | Admit: 2019-09-07 | Payer: BC Managed Care – PPO | Source: Home / Self Care | Admitting: Obstetrics and Gynecology

## 2019-09-07 LAB — CBC
HCT: 31.4 % — ABNORMAL LOW (ref 36.0–46.0)
Hemoglobin: 9.7 g/dL — ABNORMAL LOW (ref 12.0–15.0)
MCH: 25.3 pg — ABNORMAL LOW (ref 26.0–34.0)
MCHC: 30.9 g/dL (ref 30.0–36.0)
MCV: 82 fL (ref 80.0–100.0)
Platelets: 213 10*3/uL (ref 150–400)
RBC: 3.83 MIL/uL — ABNORMAL LOW (ref 3.87–5.11)
RDW: 14.8 % (ref 11.5–15.5)
WBC: 13.6 10*3/uL — ABNORMAL HIGH (ref 4.0–10.5)
nRBC: 0 % (ref 0.0–0.2)

## 2019-09-07 MED ORDER — ZOLPIDEM TARTRATE 5 MG PO TABS: 5.0000 mg | ORAL_TABLET | Freq: Every evening | ORAL | Status: DC | PRN

## 2019-09-07 MED ORDER — FLUOXETINE HCL 20 MG PO CAPS
20.0000 mg | ORAL_CAPSULE | Freq: Every day | ORAL | Status: DC
  Administered 2019-09-07 – 2019-09-08 (×2): 20 mg via ORAL
  Filled 2019-09-07 (×2): qty 1

## 2019-09-07 MED ORDER — SIMETHICONE 80 MG PO CHEW
80.0000 mg | CHEWABLE_TABLET | ORAL | Status: DC | PRN
  Administered 2019-09-07: 80 mg via ORAL
  Filled 2019-09-07: qty 1

## 2019-09-07 MED ORDER — ACETAMINOPHEN 325 MG PO TABS: 650.0000 mg | ORAL_TABLET | ORAL | Status: DC | PRN

## 2019-09-07 MED ORDER — BENZOCAINE-MENTHOL 20-0.5 % EX AERO
1.0000 "application " | INHALATION_SPRAY | CUTANEOUS | Status: DC | PRN
  Administered 2019-09-07: 1 via TOPICAL
  Filled 2019-09-07: qty 56

## 2019-09-07 MED ORDER — SENNOSIDES-DOCUSATE SODIUM 8.6-50 MG PO TABS
2.0000 | ORAL_TABLET | ORAL | Status: DC
  Administered 2019-09-07: 2 via ORAL
  Filled 2019-09-07 (×3): qty 2

## 2019-09-07 MED ORDER — COCONUT OIL OIL
1.0000 "application " | TOPICAL_OIL | Status: DC | PRN
  Administered 2019-09-07: 1 via TOPICAL

## 2019-09-07 MED ORDER — LEVOTHYROXINE SODIUM 100 MCG PO TABS
100.0000 ug | ORAL_TABLET | Freq: Every day | ORAL | Status: DC
  Administered 2019-09-07 – 2019-09-08 (×2): 100 ug via ORAL
  Filled 2019-09-07 (×2): qty 1

## 2019-09-07 MED ORDER — IBUPROFEN 600 MG PO TABS
600.0000 mg | ORAL_TABLET | Freq: Four times a day (QID) | ORAL | Status: DC
  Administered 2019-09-07 – 2019-09-08 (×3): 600 mg via ORAL
  Filled 2019-09-07 (×7): qty 1

## 2019-09-07 MED ORDER — WITCH HAZEL-GLYCERIN EX PADS: 1.0000 "application " | MEDICATED_PAD | CUTANEOUS | Status: DC | PRN

## 2019-09-07 MED ORDER — ONDANSETRON HCL 4 MG/2ML IJ SOLN: 4.0000 mg | INTRAMUSCULAR | Status: DC | PRN

## 2019-09-07 MED ORDER — ONDANSETRON HCL 4 MG PO TABS: 4.0000 mg | ORAL_TABLET | ORAL | Status: DC | PRN

## 2019-09-07 MED ORDER — DIPHENHYDRAMINE HCL 25 MG PO CAPS: 25.0000 mg | ORAL_CAPSULE | Freq: Four times a day (QID) | ORAL | Status: DC | PRN

## 2019-09-07 MED ORDER — TETANUS-DIPHTH-ACELL PERTUSSIS 5-2.5-18.5 LF-MCG/0.5 IM SUSP: 0.5000 mL | Freq: Once | INTRAMUSCULAR | Status: DC

## 2019-09-07 MED ORDER — PRENATAL MULTIVITAMIN CH
1.0000 | ORAL_TABLET | Freq: Every day | ORAL | Status: DC
  Administered 2019-09-07: 1 via ORAL
  Filled 2019-09-07: qty 1

## 2019-09-07 MED ORDER — DIBUCAINE (PERIANAL) 1 % EX OINT: 1.0000 "application " | TOPICAL_OINTMENT | CUTANEOUS | Status: DC | PRN

## 2019-09-07 NOTE — Lactation Note (Addendum)
This note was copied from a baby's chart. Lactation Consultation Note Baby 4 hrs old. Mom stated the baby is wanting to stay on the breast. Mom is BF/formula. Mom is giving formula as supplement after BF. Mom has 29 yr old that she BF for 3 months and her milk dried up. Mom had to supplement.  Mom is on medication for hypothyroidism.  Mom's breast are heavy w/everted nipples. Hand expression demonstrated w/colostrum noted.  Mom stated baby has been dribbling a little spit up. LC changed baby's blanket d/t wet around baby's neck. Encouraged STS while BF. Discussed I&O, breast massage, newborn feeding habits and behavior, feeding positions, support, supply and demand.  Since mom has hx: low milk supply LC discussed pumping w/mom. Mom agreed. While LC went to get pump kit mom rolled over and went to sleep. [place] set up DEBP. Mom rolled over said Thank you. LC encouraged to pump every 3 hrs for stimulation. Asked mom to get RN to show her how to pump in am when she's ready. Mom stated OK. Mom asked if Lactation will come back to see her tomorrow. LC stated yes. If she needs assistance in latching tonight please call.  Lactation brochure given.  Patient Name: Laura Baxter [id number] Date: 09/07/2019 Reason for consult: Initial assessment;Early term 37-38.6wks;Maternal endocrine disorder Type of Endocrine Disorder?: Thyroid   Maternal Data Has patient been taught Hand Expression?: Yes Does the patient have breastfeeding experience prior to this delivery?: Yes  Feeding Feeding Type: Formula Nipple Type: Slow - flow  LATCH Score Latch: Repeated attempts needed to sustain latch, nipple held in mouth throughout feeding, stimulation needed to elicit sucking reflex.  Audible Swallowing: Spontaneous and intermittent  Type of Nipple: Everted at rest and after stimulation  Comfort (Breast/Nipple): Soft / non-tender  Hold (Positioning): No assistance needed to correctly position infant at  breast.  LATCH Score: 9  Interventions Interventions: Breast feeding basics reviewed;Support pillows;Position options;Skin to skin;Breast massage;Hand express;Breast compression;DEBP  Lactation Tools Discussed/Used Tools: Pump Breast pump type: Double-Electric Breast Pump Pump Review: Milk Storage (mom wanted to go to sleep) Initiated by:: Allayne Stack RN IBCLC Date initiated:: 09/07/19   Consult Status Consult Status: Follow-up Date: 09/07/19 Follow-up type: In-patient    Hazelle Woollard, Elta Gu[place] 09/07/2019, 3:01 AM

## 2019-09-07 NOTE — Lactation Note (Signed)
This note was copied from a baby's chart. Lactation Consultation Note  Patient Name: Laura Baxter ALPFX'T Date: 09/07/2019   Mom had requested to see lactation.  Attempt to see mom.  Mom was on phone with her insurance company she reported and asked LC if she could come back later.  Maternal Data    Feeding    LATCH Score                   Interventions    Lactation Tools Discussed/Used     Consult Status      Laura Baxter [name] 09/07/2019, 6:25 PM

## 2019-09-07 NOTE — Progress Notes (Signed)
CSW consulted as [name] scored 26 on [place] CSW went to speak with [name] at bedside to address further needs. CSW congratulated [name] and FOB on the birth of infant. CSW advised [name] of the HIPPA policy in which [name] was agreeable to Fob remaining in the room while CSW spoke with her. 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 [name] reported that she has been feeling much better the last two days however. [name] also reported that she feels that her depression is sometimes situational. [name] reported that she takes Prozac for her mental health and reported that she feels that it is working well for her. [name] reported that she also see's a therapist with [facility] once a week. [name] reports that therapy is very helpful for her and that she enjoys it. When asked [name] does report that she has a hx of ADHD however she doesn't take any medication for it. [name] also reported that the score of 26 on [place] could have been attributed to "me being taken ot of work 6 weeks earlier than I had planned". [name] reported that she isn't feeling SI or HI and reported that her supports are her spouse and other family and friends.   CSW took time to provide [name] with PPD and SIDS education. [name] was given PPD Checklist to keep track of feelings as they relate to PPD. [name] thanked CSW and reported no other concerns at this time to CSW.    Claude Manges Kisa Fujii, MSW, LCSW [facility] [place] [phone]

## 2019-09-07 NOTE — Lactation Note (Signed)
This note was copied from a baby's chart. Lactation Consultation Note  Patient Name: Laura Baxter HYHOO'I Date: 09/07/2019  Rn assisting mom with breastfeeding on arrival. Infant coming off and on in cross cradle hold. Mom reports she feels she latches better in football. Assisted mom in switching her to football. Infant did not come off and on as much.   Left mom and baby breastfeeding.  Mom reports nipples slightly sore but comfortable at this time.  Urged mom to pump everytime gives bottle. Urged to call lactation as needed.  Maternal Data    Feeding Feeding Type: Breast Fed  [facility] Score                   Interventions    Lactation Tools Discussed/Used     Consult Status      Laura Baxter 09/07/2019, 6:58 PM

## 2019-09-07 NOTE — Progress Notes (Signed)
[name] was referred for history of depression/anxiety. * Referral screened out by Clinical Social Worker because none of the following criteria appear to apply: ~ History of anxiety/depression during this pregnancy, or of post-partum depression following prior delivery. ~ Diagnosis of anxiety and/or depression within last 3 years OR * [name]'Baxter symptoms currently being treated with medication and/or therapy. Per further chart review, it appears that [name] has active prescription for Prozac.     Please contact the Clinical Social Worker if needs arise, by [name] request, or if [name] scores greater than 9/yes to question 10 on [place] Postpartum Depression Screen.    Laura Baxter. Laura Baxter, MSW, LCSW [facility] [place] [phone]  

## 2019-09-07 NOTE — Progress Notes (Signed)
Post Partum Day 1  Subjective: no complaints, up ad lib, voiding, tolerating PO and + flatus  Objective: Blood pressure 109/65, pulse 77, temperature 98 F (36.7 C), temperature source Oral, resp. rate 18, last menstrual period 08/10/2018, SpO2 100 %, unknown if currently breastfeeding.  Physical Exam:  General: alert, cooperative and appears stated age Lochia: appropriate Uterine Fundus: firm Incision: NA DVT Evaluation: No evidence of DVT seen on physical exam. Negative Homan's sign. No cords or calf tenderness. No significant calf/ankle edema.  Recent Labs    09/06/19 0808 09/07/19 0434  HGB 10.4* 9.7*  HCT 33.1* 31.4*    Assessment/Plan: Plan for discharge tomorrow and Lactation consult   LOS: 1 day   Laura Baxter 09/07/2019, 8:56 AM

## 2019-09-08 DIAGNOSIS — D509 Iron deficiency anemia, unspecified: Secondary | ICD-10-CM | POA: Diagnosis present

## 2019-09-08 MED ORDER — MAGNESIUM OXIDE 400 (241.3 MG) MG PO TABS
400.0000 mg | ORAL_TABLET | Freq: Every day | ORAL | Status: DC
  Administered 2019-09-08: 400 mg via ORAL
  Filled 2019-09-08: qty 1

## 2019-09-08 MED ORDER — POLYSACCHARIDE IRON COMPLEX 150 MG PO CAPS: 150.0000 mg | ORAL_CAPSULE | Freq: Every day | ORAL | 3 refills | Status: DC

## 2019-09-08 MED ORDER — IBUPROFEN 600 MG PO TABS: 600.0000 mg | ORAL_TABLET | Freq: Four times a day (QID) | ORAL | 0 refills | Status: DC

## 2019-09-08 MED ORDER — ACETAMINOPHEN 325 MG PO TABS: 650.0000 mg | ORAL_TABLET | ORAL | Status: DC | PRN

## 2019-09-08 MED ORDER — MAGNESIUM OXIDE 400 (241.3 MG) MG PO TABS: 400.0000 mg | ORAL_TABLET | Freq: Every day | ORAL | Status: DC

## 2019-09-08 MED ORDER — FLUOXETINE HCL 20 MG PO CAPS: 20.0000 mg | ORAL_CAPSULE | Freq: Every day | ORAL | 1 refills | Status: AC

## 2019-09-08 MED ORDER — POLYSACCHARIDE IRON COMPLEX 150 MG PO CAPS
150.0000 mg | ORAL_CAPSULE | Freq: Every day | ORAL | Status: DC
  Administered 2019-09-08: 150 mg via ORAL
  Filled 2019-09-08: qty 1

## 2019-09-08 NOTE — Discharge Summary (Signed)
Postpartum Discharge Summary  Date of Service updated 09/08/19      Patient Name: Laura Baxter DOB: October 04, 1990 MRN: [id number]  Date of admission: 09/06/2019 Delivery date:09/06/2019  Delivering provider: Gavin Potters K  Date of discharge: 09/08/2019  Admitting diagnosis: Normal labor [O80, Z37.9] Intrauterine pregnancy: 109w6d    Secondary diagnosis:  Principal Problem:   Postpartum care following vaginal delivery 7/13 Active Problems:   Vaginal delivery 7/13   IDA (iron deficiency anemia)  Additional problems:  Patient Active Problem List   Diagnosis Date Noted  . Postpartum care following vaginal delivery 7/13 09/06/2019    Priority: Medium  . IDA (iron deficiency anemia) 09/08/2019  . Vaginal delivery 7/13 09/06/2019  . Preterm uterine contractions in third trimester, antepartum 07/25/2019  . Hypothyroidism 02/10/2017  . Obstetric vaginal laceration 08/22/2016  . 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 Family history of cleft lip and palate 08/17/2016  . Shellfish allergy 08/17/2016  . 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 yo [id number] admitted in Latent Labor on 09/06/2019. Patient had an uncomplicated labor course as follows:  Membrane Rupture Time/Date: 5:30 AM ,09/06/2019   Delivery Method:Vaginal, Spontaneous  Episiotomy: None  Lacerations:  Periurethral  Patient had an uncomplicated postpartum course.  She is ambulating, tolerating a regular diet, passing flatus, and urinating well. Patient is discharged home in stable condition on 09/08/19.  Newborn Data: Birth date:09/06/2019  Birth time:10:41 PM  Gender:Female  Living status:Living  Apgars:9 ,9  Weight:2784 g   Magnesium Sulfate  received: No BMZ received: No Rhophylac:N/A MMR:N/A Transfusion:No  Physical exam  Vitals:   09/07/19 0913 09/07/19 1400 09/07/19 2213 09/08/19 0600  BP: 101/68 97/62 105/69 (!) 97/55  Pulse:  69 77 67  Resp: 18 17 18 18   Temp: 97.9 F (36.6 C) 98.2 F (36.8 C)    TempSrc: Oral Oral    SpO2: 99% 100%     General: alert, cooperative and no distress Lochia: appropriate Uterine Fundus: firm Incision: N/A DVT Evaluation: No evidence of DVT seen on physical exam. No cords or calf tenderness. No significant calf/ankle edema. Labs: Lab Results  Component Value Date   WBC 13.6 (H) 09/07/2019   HGB 9.7 (L) 09/07/2019   HCT 31.4 (L) 09/07/2019   MCV 82.0 09/07/2019   PLT 213 09/07/2019   CMP Latest Ref Rng & Units 08/28/2019  Glucose 70 - 99 mg/dL 87  BUN 6 - 20 mg/dL 6  Creatinine 0.44 - 1.00 mg/dL 0.62  Sodium 135 - 145 mmol/L 136  Potassium 3.5 - 5.1 mmol/L 3.4(L)  Chloride 98 - 111 mmol/L 107  CO2 22 - 32 mmol/L 19(L)  Calcium 8.9 - 10.3 mg/dL 8.5(L)  Total Protein 6.5 - 8.1 g/dL 5.6(L)  Total Bilirubin 0.3 - 1.2 mg/dL 0.4  Alkaline Phos 38 - 126 U/L 100  AST 15 - 41 U/L 16  ALT 0 - 44 U/L 13   Edinburgh Score: Edinburgh Postnatal Depression Scale Screening Tool 09/07/2019  I have been able to  laugh and see the funny side of things. 2  I have looked forward with enjoyment to things. 3  I have blamed myself unnecessarily when things went wrong. 3  I have been anxious or worried for no good reason. 3  I have felt scared or panicky for no good reason. 3  Things have been getting on top of me. 3  I have been so unhappy that I have had difficulty sleeping. 3  I have felt sad or miserable. 3  I have been so unhappy that I have been crying. 3  The thought of harming myself has occurred to me. 0  Edinburgh Postnatal Depression Scale Total 26      After visit meds:  Allergies as of 09/08/2019      Reactions   Iodine Rash   Shellfish Allergy Rash   Latex Itching    Power in the gloves cause redness      Medication List    STOP taking these medications   diphenhydramine-acetaminophen 25-500 MG Tabs tablet Commonly known as: TYLENOL PM   NIFEdipine 10 MG capsule Commonly known as: PROCARDIA     TAKE these medications   acetaminophen 325 MG tablet Commonly known as: Tylenol Take 2 tablets (650 mg total) by mouth every 4 (four) hours as needed (for pain scale < 4).   FLUoxetine 20 MG capsule Commonly known as: PROzac Take 1 capsule (20 mg total) by mouth daily.   ibuprofen 600 MG tablet Commonly known as: ADVIL Take 1 tablet (600 mg total) by mouth every 6 (six) hours.   iron polysaccharides 150 MG capsule Commonly known as: NIFEREX Take 1 capsule (150 mg total) by mouth daily. Start taking on: September 09, 2019   levothyroxine 100 MCG tablet Commonly known as: SYNTHROID Take 100 mcg by mouth daily before breakfast.   magnesium oxide 400 (241.3 Mg) MG tablet Commonly known as: MAG-OX Take 1 tablet (400 mg total) by mouth daily. Start taking on: September 09, 2019   PRENATAL + DHA PO Take 1 tablet by mouth daily.        Discharge home in stable condition Infant Feeding: Breast Infant Disposition:home with mother Discharge instruction: per After Visit Summary and Postpartum booklet. Activity: Advance as tolerated. Pelvic rest for 6 weeks.  Diet: routine diet Anticipated Birth Control: spouse has appt for vasectomy Postpartum Appointment:6 weeks Additional Postpartum F/U: Postpartum Depression checkup Future Appointments:No future appointments. Follow up Visit:  [facility]. Schedule an appointment as soon as possible for a visit in 6 week(s).   Specialty: Obstetrics and Gynecology Contact information: [address] [place] [address] [phone]                  09/08/2019 Arrie Eastern, CNM

## 2019-09-08 NOTE — Lactation Note (Signed)
This note was copied from a baby's chart. Lactation Consultation Note  Patient Name: Laura Baxter'S Date: 09/08/2019 Reason for consult: Follow-up assessment;Early term 37-38.6wks;Maternal endocrine disorder Type of Endocrine Disorder?: Thyroid  LC in to visit with P2 Mom of ET infant at 32 hrs old.  Baby at 3.4% weight loss on day of discharge.  Mom is breast and formula feeding.  Baby fussy and showing cues.  Baby trying to latch, but noted to not be opening widely enough.  Mom states that baby had just fed 15 mins on breast, fed formula 10 ml, and then fed 15 mins on 2nd breast.  Mom denies having difficult time latching, but does states her nipples are sore.  Both nipples noted to be slightly abraded on tips, but not cracks or blistering.  Reviewed hand expression, colostrum flowing well.  Assisted Mom to elevate baby a bit higher and repositioned pillow at her back.  Baby latched easily, but noted to be shallow onto base of nipple.  On oral assessment, baby has a narrow mouth and doesn't open widely.  Sucking on finger is tight.  Palate appears normal, slight labial frenulum noted, and tongue extends, but doesn't lift in mouth.  Unable to feel a lingual frenulum.    Initiated a 24 mm nipple shield to see if baby would open wider and latch deeper to breast.  Instructed Mom to wait until baby opens wider before bringing her onto breast.  Baby tightly latched, pulled down on chin to flange lip.  Baby just sat there and wouldn't suckle.  Initiated formula in a 78fr feeding tube to see if baby would start sucking and swallowing.    Talked to Mom about OP lactation follow-up.  Recommended she have a consult and offered to refer her to OP clinic.  Mom in agreement.    Mom states she has pumped 3 times.  Talked about importance of frequent pumping after baby breastfeeds, to support a full milk supply ( mom has a history of low milk supply with her first baby).   Mom has a DEBP at home, but  doesn't know the brand name.  Mom informed of the [employer] DEBP available to rent in gift shop.  Hours of gift shop shared.  Explained the hospital grade pump will support her milk supply more optimally.   Engorgement prevention and treatment reviewed.  Mom knows she can call prn for concerns.   Interventions Interventions: Breast feeding basics reviewed;Assisted with latch;Skin to skin;Breast massage;Hand express;Breast compression;Adjust position;Support pillows;Position options;Expressed milk;DEBP  Lactation Tools Discussed/Used Tools: Nipple Shields Nipple shield size: 24 Breast pump type: Double-Electric Breast Pump   Consult Status Consult Status: Complete Date: 09/08/19 Follow-up type: Out-patient    [name] 09/08/2019, 11:52 AM

## 2019-09-19 DIAGNOSIS — F4322 Adjustment disorder with anxiety: Secondary | ICD-10-CM | POA: Diagnosis not present

## 2019-09-29 DIAGNOSIS — F4322 Adjustment disorder with anxiety: Secondary | ICD-10-CM | POA: Diagnosis not present

## 2019-10-03 ENCOUNTER — Other Ambulatory Visit: Payer: Self-pay

## 2019-10-03 ENCOUNTER — Ambulatory Visit
Admission: EM | Admit: 2019-10-03 | Discharge: 2019-10-03 | Disposition: A | Discharge status: Home / Self Care | Payer: BC Managed Care – PPO | Attending: Emergency Medicine | Admitting: Emergency Medicine

## 2019-10-03 DIAGNOSIS — N61 Mastitis without abscess: Secondary | ICD-10-CM | POA: Diagnosis not present

## 2019-10-03 MED ORDER — CEPHALEXIN 500 MG PO CAPS
500.0000 mg | ORAL_CAPSULE | Freq: Four times a day (QID) | ORAL | 0 refills | Status: AC
Start: 2019-10-03 — End: 2019-10-10

## 2019-10-03 NOTE — ED Triage Notes (Signed)
Pt c/o fever and chills x2-3 days. States noticed red, tenderness, and heat to lt side of rt breast this morning. States she is 3.5 wks post partum and is breat pumping only.

## 2019-10-03 NOTE — ED Provider Notes (Signed)
[facility]    CSN: [id number] Arrival date & time: 10/03/19  1156      History   Chief Complaint Chief Complaint  Patient presents with  . Fever    HPI Laura Baxter is a 29 y.o. female presenting for fever, chills x2-3 days. Patient currently breast-feeding: No change thereof. Is 3.5 weeks postpartum. Has noted heat and tenderness to left side of her right breast this morning.  Mild nipple irritation as well.  Denies nipple retraction or discharge.    Past Medical History:  Diagnosis Date  . History of febrile seizure    INFANT  . History of hyperthyroidism    DX 2015--  TSH LEVELS NORMALIZED WITHOUT ANY TREATMENT  . History of hypotension   . History of ovarian cyst   . 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 Iron deficiency anemia   . Pilonidal cyst with abscess   . Wears glasses     Patient Active Problem List   Diagnosis Date Noted  . IDA (iron deficiency anemia) 09/08/2019  . Vaginal delivery 7/13 09/06/2019  . Postpartum care following vaginal delivery 7/13 09/06/2019  . Preterm uterine contractions in third trimester, antepartum 07/25/2019  . Hypothyroidism 02/10/2017  . Obstetric vaginal laceration 08/22/2016  . 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 Family history of cleft lip and palate 08/17/2016  . Shellfish allergy 08/17/2016  . 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 PILONIDAL CYST EXCISION N/A 06/17/2017   Procedure: CYST EXCISION PILONIDAL;  Surgeon: Sheliah Hatch, De Blanch, MD;  Location: [facility];  Service: General;  Laterality: N/A;  . SHOULDER SURGERY Left 2012   capsular repair    OB History    Gravida  3   Para  2   Term  2   Preterm      AB  1   Living  2     SAB  1   TAB      Ectopic      Multiple  0   Live Births  2            Home Medications    Prior to  Admission medications   Medication Sig Start Date End Date Taking? Authorizing Provider  acetaminophen (TYLENOL) 325 MG tablet Take 2 tablets (650 mg total) by mouth every 4 (four) hours as needed (for pain scale < 4). 09/08/19   Roma Schanz, CNM  cephALEXin (KEFLEX) 500 MG capsule Take 1 capsule (500 mg total) by mouth 4 (four) times daily for 7 days. 10/03/19 10/10/19  Hall-Potvin, [place], PA-C  FLUoxetine (PROZAC) 20 MG capsule Take 1 capsule (20 mg total) by mouth daily. 09/08/19 03/06/20  Roma Schanz, CNM  ibuprofen (ADVIL) 600 MG tablet Take 1 tablet (600 mg total) by mouth every 6 (six) hours. 09/08/19   Roma Schanz, CNM  iron polysaccharides (NIFEREX) 150 MG capsule Take 1 capsule (150 mg total) by mouth daily. 09/09/19 01/07/20  Roma Schanz, CNM  levothyroxine (SYNTHROID) 100 MCG tablet Take 100 mcg by mouth daily before breakfast.    [provider]  magnesium oxide (MAG-OX) 400 (241.3 Mg) MG tablet Take 1 tablet (400 mg total) by mouth daily. 09/09/19   Roma Schanz, CNM  Prenatal-FeFum-FA-DHA w/o A (PRENATAL + DHA PO) Take 1 tablet by mouth daily.     [provider]    [facility]  History Family History  Problem Relation Age of Onset  . Depression Mother   . Anemia Mother   . Heart attack Father   . Hypertension Father   . Hyperlipidemia Father   . Heart disease Father   . Depression Father   . Glaucoma Father   . Graves' disease Father   . Thyroid disease Father   . Osteoporosis Father   . Cancer Maternal Grandmother        pancreatic  . Breast cancer Paternal Grandmother   . Leukemia Paternal Grandmother   . Leukemia Paternal Grandfather     Social History Social History   Tobacco Use  . Smoking status: Never Smoker  . Smokeless tobacco: Never Used  Vaping Use  . Vaping Use: Never used  Substance Use Topics  . Alcohol use: No  . Drug use: No     Allergies   Iodine, Shellfish allergy, and Latex   Review of Systems As per  HPI   Physical Exam Triage Vital Signs ED Triage Vitals [10/03/19 1350]  Enc Vitals Group     BP 114/78     Pulse Rate 97     Resp 18     Temp 99.7 F (37.6 C)     Temp Source Oral     SpO2 98 %     Weight      Height      Head Circumference      Peak Flow      Pain Score 3     Pain Loc      Pain Edu?      Excl. in GC?    No data found.  Updated Vital Signs BP 114/78 (BP Location: Left Arm)   Pulse 97   Temp 99.7 F (37.6 C) (Oral)   Resp 18   LMP 08/10/2018   SpO2 98%   Breastfeeding Yes Comment: pump only  Visual Acuity Right Eye Distance:   Left Eye Distance:   Bilateral Distance:    Right Eye Near:   Left Eye Near:    Bilateral Near:     Physical Exam Constitutional:      General: She is not in acute distress. HENT:     Head: Normocephalic and atraumatic.  Eyes:     General: No scleral icterus.    Pupils: Pupils are equal, round, and reactive to light.  Cardiovascular:     Rate and Rhythm: Normal rate.  Pulmonary:     Effort: Pulmonary effort is normal.  Chest:    Skin:    Coloration: Skin is not jaundiced or pale.  Neurological:     Mental Status: She is alert and oriented to person, place, and time.      UC Treatments / Results  Labs (all labs ordered are listed, but only abnormal results are displayed) Labs Reviewed - No data to display  EKG   Radiology No results found.  Procedures Procedures (including critical care time)  Medications Ordered in UC Medications - No data to display  Initial Impression / Assessment and Plan / UC Course  I have reviewed the triage vital signs and the nursing notes.  Pertinent labs & imaging results that were available during my care of the patient were reviewed by me and considered in my medical decision making (see chart for details).     Patient with elevated temperature (controlled with OTC medication) and exam concerning for mastitis.  Given fever, chills without URI symptoms, will  cover for infectious mastitis  with Keflex as outlined below.  Have patient follow-up with her OB/GYN later this week.  Return precautions discussed, pt verbalized understanding and is agreeable to plan. Final Clinical Impressions(s) / UC Diagnoses   Final diagnoses:  Mastitis, acute     Discharge Instructions     Take antibiotic 4 times daily with food. May apply hot compresses, perform massage as discussed for additional pain relief Follow-up with OB/GYN in 1 week for repeat evaluation.    ED Prescriptions    Medication Sig Dispense Auth. Provider   cephALEXin (KEFLEX) 500 MG capsule Take 1 capsule (500 mg total) by mouth 4 (four) times daily for 7 days. 28 capsule Hall-Potvin, [place], PA-C     PDMP not reviewed this encounter.   Hall-Potvin, [place], [place] 10/03/19 1517

## 2019-10-03 NOTE — Discharge Instructions (Addendum)
Take antibiotic 4 times daily with food. May apply hot compresses, perform massage as discussed for additional pain relief Follow-up with OB/GYN in 1 week for repeat evaluation.

## 2019-10-17 DIAGNOSIS — F4322 Adjustment disorder with anxiety: Secondary | ICD-10-CM | POA: Diagnosis not present

## 2019-10-21 ENCOUNTER — Encounter: Payer: Self-pay | Admitting: Endocrinology

## 2019-10-21 ENCOUNTER — Other Ambulatory Visit: Payer: Self-pay

## 2019-10-21 ENCOUNTER — Ambulatory Visit (INDEPENDENT_AMBULATORY_CARE_PROVIDER_SITE_OTHER): Payer: BC Managed Care – PPO | Admitting: Endocrinology

## 2019-10-21 VITALS — BP 110/74 | HR 70 | Wt 149.2 lb

## 2019-10-21 DIAGNOSIS — E039 Hypothyroidism, unspecified: Secondary | ICD-10-CM

## 2019-10-21 LAB — T4, FREE: Free T4: 1.25 ng/dL (ref 0.60–1.60)

## 2019-10-21 LAB — TSH: TSH: 0.36 u[IU]/mL (ref 0.35–4.50)

## 2019-10-21 NOTE — Progress Notes (Signed)
Subjective:    Patient ID: Laura Baxter, female    DOB: August 30, 1990, 30 y.o.   MRN: [id number]  HPI Pt returns for f/u of hypothyroidism (dx'ed 2015; she cycled twice between hyper- and hypothyroidism until 2018, when she developed her current hypothyroidism; she takes synthroid; US showed thyroiditis; she had a normal ACTH stim test in 2019).  She is 6 weeks postpartun.  pt states she feels well in general.   Past Medical History:  Diagnosis Date  . History of febrile seizure    INFANT  . History of hyperthyroidism    DX 2015--  TSH LEVELS NORMALIZED WITHOUT ANY TREATMENT  . History of hypotension   . History of ovarian cyst   . 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 Iron deficiency anemia   . Pilonidal cyst with abscess   . Wears glasses     Past Surgical History:  Procedure Laterality Date  . PILONIDAL CYST EXCISION N/A 06/17/2017   Procedure: CYST EXCISION PILONIDAL;  Surgeon: Sheliah Hatch, De Blanch, MD;  Location: [facility];  Service: General;  Laterality: N/A;  . SHOULDER SURGERY Left 2012   capsular repair    Social History   Socioeconomic History  . Marital status: Married    Spouse name: Not on file  . Number of children: Not on file  . Years of education: Not on file  . Highest education level: Not on file  Occupational History  . Not on file  Tobacco Use  . Smoking status: Never Smoker  . Smokeless tobacco: Never Used  Vaping Use  . Vaping Use: Never used  Substance and Sexual Activity  . Alcohol use: No  . Drug use: No  . Sexual activity: Yes    Comment: IUD placed 02/ 2019 previously  Other Topics Concern  . Not on file  Social History Narrative  . Not on file   Social Determinants of Health   Financial Resource Strain:   . Difficulty of Paying Living Expenses: Not on file  Food Insecurity:   . Worried About Programme researcher, broadcasting/film/video in the Last Year: Not on file  .  Ran Out of Food in the Last Year: Not on file  Transportation Needs:   . Lack of Transportation (Medical): Not on file  . Lack of Transportation (Non-Medical): Not on file  Physical Activity:   . Days of Exercise per Week: Not on file  . Minutes of Exercise per Session: Not on file  Stress:   . Feeling of Stress : Not on file  Social Connections:   . Frequency of Communication with Friends and Family: Not on file  . Frequency of Social Gatherings with Friends and Family: Not on file  . Attends Religious Services: Not on file  . Active Member of Clubs or Organizations: Not on file  . Attends Banker Meetings: Not on file  . Marital Status: Not on file  Intimate Partner Violence:   . Fear of Current or Ex-Partner: Not on file  . Emotionally Abused: Not on file  . Physically Abused: Not on file  . Sexually Abused: Not on file    Current Outpatient Medications on File Prior to Visit  Medication Sig Dispense Refill  . FLUoxetine (PROZAC) 20 MG capsule Take 1 capsule (20 mg total) by mouth daily. 90 capsule 1  . iron polysaccharides (NIFEREX) 150 MG capsule Take 1 capsule (150 mg total) by mouth daily. 30  capsule 3  . levothyroxine (SYNTHROID) 100 MCG tablet Take 100 mcg by mouth daily before breakfast.     No current facility-administered medications on file prior to visit.    Allergies  Allergen Reactions  . Iodine Rash  . Shellfish Allergy Rash  . Latex Itching    Power in the gloves cause redness    Family History  Problem Relation Age of Onset  . Depression Mother   . Anemia Mother   . Heart attack Father   . Hypertension Father   . Hyperlipidemia Father   . Heart disease Father   . Depression Father   . Glaucoma Father   . Graves' disease Father   . Thyroid disease Father   . Osteoporosis Father   . Cancer Maternal Grandmother        pancreatic  . Breast cancer Paternal Grandmother   . Leukemia Paternal Grandmother   . Leukemia Paternal  Grandfather     BP 110/74   Pulse 70   Wt 149 lb 3.2 oz (67.7 kg)   LMP 08/10/2018   SpO2 99%   BMI 25.61 kg/m    Review of Systems Weight is less now than prior to pregnancy.  Depression is well-controlled    Objective:   Physical Exam VITAL SIGNS:  See vs page GENERAL: no distress NECK: There is no palpable thyroid enlargement.  No thyroid nodule is palpable.  No palpable lymphadenopathy at the anterior neck.    Lab Results  Component Value Date   TSH 0.36 10/21/2019       Assessment & Plan:  Hypothyroidism: well-replaced. Please continue the same medication.

## 2019-10-21 NOTE — Patient Instructions (Addendum)
Blood tests are requested for you today.  We'll let you know about the results.   It is best to never miss the medication.  However, if you do miss it, next best is to double up the next time.    Please come back for a follow-up appointment in January.

## 2019-10-25 DIAGNOSIS — F4322 Adjustment disorder with anxiety: Secondary | ICD-10-CM | POA: Diagnosis not present

## 2019-10-30 DIAGNOSIS — R05 Cough: Secondary | ICD-10-CM | POA: Diagnosis not present

## 2019-10-30 DIAGNOSIS — Z20822 Contact with and (suspected) exposure to covid-19: Secondary | ICD-10-CM | POA: Diagnosis not present

## 2019-10-30 DIAGNOSIS — J029 Acute pharyngitis, unspecified: Secondary | ICD-10-CM | POA: Diagnosis not present

## 2019-11-07 DIAGNOSIS — R509 Fever, unspecified: Secondary | ICD-10-CM | POA: Diagnosis not present

## 2019-11-07 DIAGNOSIS — J069 Acute upper respiratory infection, unspecified: Secondary | ICD-10-CM | POA: Diagnosis not present

## 2019-11-09 DIAGNOSIS — F4322 Adjustment disorder with anxiety: Secondary | ICD-10-CM | POA: Diagnosis not present

## 2019-11-15 DIAGNOSIS — F4322 Adjustment disorder with anxiety: Secondary | ICD-10-CM | POA: Diagnosis not present

## 2019-11-16 DIAGNOSIS — F4322 Adjustment disorder with anxiety: Secondary | ICD-10-CM | POA: Diagnosis not present

## 2019-11-22 DIAGNOSIS — F4322 Adjustment disorder with anxiety: Secondary | ICD-10-CM | POA: Diagnosis not present

## 2019-11-30 DIAGNOSIS — F4322 Adjustment disorder with anxiety: Secondary | ICD-10-CM | POA: Diagnosis not present

## 2019-12-02 DIAGNOSIS — Z6825 Body mass index (BMI) 25.0-25.9, adult: Secondary | ICD-10-CM | POA: Diagnosis not present

## 2019-12-02 DIAGNOSIS — Z01411 Encounter for gynecological examination (general) (routine) with abnormal findings: Secondary | ICD-10-CM | POA: Diagnosis not present

## 2019-12-02 DIAGNOSIS — F321 Major depressive disorder, single episode, moderate: Secondary | ICD-10-CM | POA: Diagnosis not present

## 2019-12-06 DIAGNOSIS — F4322 Adjustment disorder with anxiety: Secondary | ICD-10-CM | POA: Diagnosis not present

## 2019-12-20 DIAGNOSIS — F4322 Adjustment disorder with anxiety: Secondary | ICD-10-CM | POA: Diagnosis not present

## 2019-12-27 DIAGNOSIS — F4322 Adjustment disorder with anxiety: Secondary | ICD-10-CM | POA: Diagnosis not present

## 2020-01-04 DIAGNOSIS — F4322 Adjustment disorder with anxiety: Secondary | ICD-10-CM | POA: Diagnosis not present

## 2020-01-09 DIAGNOSIS — F4322 Adjustment disorder with anxiety: Secondary | ICD-10-CM | POA: Diagnosis not present

## 2020-01-13 ENCOUNTER — Telehealth: Payer: Self-pay | Admitting: Endocrinology

## 2020-01-13 DIAGNOSIS — E039 Hypothyroidism, unspecified: Secondary | ICD-10-CM

## 2020-01-13 NOTE — Telephone Encounter (Signed)
Please see below.

## 2020-01-13 NOTE — Telephone Encounter (Signed)
Patient called to request order for Thyroid labs.  States Dr Everardo All advised she could just cal and get albs done when she felt like her Thyroid was getting our of her normal.  Labs will need to be ordered for her.  Let us know and we can get her scheduled once those are in

## 2020-01-13 NOTE — Telephone Encounter (Signed)
Ok, I have requested.  Please come in to have drawn

## 2020-01-16 ENCOUNTER — Other Ambulatory Visit (INDEPENDENT_AMBULATORY_CARE_PROVIDER_SITE_OTHER): Payer: Self-pay

## 2020-01-16 ENCOUNTER — Other Ambulatory Visit: Payer: Self-pay

## 2020-01-16 DIAGNOSIS — F4322 Adjustment disorder with anxiety: Secondary | ICD-10-CM | POA: Diagnosis not present

## 2020-01-16 DIAGNOSIS — E039 Hypothyroidism, unspecified: Secondary | ICD-10-CM

## 2020-01-16 LAB — T4, FREE: Free T4: 1.21 ng/dL (ref 0.60–1.60)

## 2020-01-16 LAB — TSH: TSH: 1.21 u[IU]/mL (ref 0.35–4.50)

## 2020-01-16 NOTE — Telephone Encounter (Signed)
LMTCB and sent mychart message to have her call office to schedule a time to have those done.

## 2020-01-23 DIAGNOSIS — F4322 Adjustment disorder with anxiety: Secondary | ICD-10-CM | POA: Diagnosis not present

## 2020-02-01 DIAGNOSIS — F4322 Adjustment disorder with anxiety: Secondary | ICD-10-CM | POA: Diagnosis not present

## 2020-02-22 DIAGNOSIS — F4322 Adjustment disorder with anxiety: Secondary | ICD-10-CM | POA: Diagnosis not present

## 2020-02-29 DIAGNOSIS — F4322 Adjustment disorder with anxiety: Secondary | ICD-10-CM | POA: Diagnosis not present

## 2020-03-14 DIAGNOSIS — F4322 Adjustment disorder with anxiety: Secondary | ICD-10-CM | POA: Diagnosis not present

## 2020-03-16 ENCOUNTER — Ambulatory Visit: Payer: BC Managed Care – PPO | Admitting: Endocrinology

## 2020-03-20 ENCOUNTER — Other Ambulatory Visit: Payer: Self-pay

## 2020-03-20 ENCOUNTER — Encounter (HOSPITAL_COMMUNITY): Payer: Self-pay

## 2020-03-20 ENCOUNTER — Emergency Department (HOSPITAL_COMMUNITY)
Admission: EM | Admit: 2020-03-20 | Discharge: 2020-03-21 | Disposition: A | Discharge status: Home / Self Care | Payer: BC Managed Care – PPO | Attending: Emergency Medicine | Admitting: Emergency Medicine

## 2020-03-20 DIAGNOSIS — Z79899 Other long term (current) drug therapy: Secondary | ICD-10-CM | POA: Diagnosis not present

## 2020-03-20 DIAGNOSIS — E039 Hypothyroidism, unspecified: Secondary | ICD-10-CM | POA: Insufficient documentation

## 2020-03-20 DIAGNOSIS — R112 Nausea with vomiting, unspecified: Secondary | ICD-10-CM | POA: Insufficient documentation

## 2020-03-20 DIAGNOSIS — F4322 Adjustment disorder with anxiety: Secondary | ICD-10-CM | POA: Diagnosis not present

## 2020-03-20 DIAGNOSIS — Z91040 Latex allergy status: Secondary | ICD-10-CM | POA: Diagnosis not present

## 2020-03-20 DIAGNOSIS — R519 Headache, unspecified: Secondary | ICD-10-CM | POA: Diagnosis not present

## 2020-03-20 MED ORDER — OXYCODONE-ACETAMINOPHEN 5-325 MG PO TABS
1.0000 | ORAL_TABLET | ORAL | Status: DC | PRN
  Administered 2020-03-20: 1 via ORAL
  Filled 2020-03-20: qty 1

## 2020-03-20 NOTE — ED Triage Notes (Signed)
Patient reports headache starting tonight, has been having headache the last few weeks, reports driving at sunset seems to trigger headache.

## 2020-03-21 ENCOUNTER — Emergency Department (HOSPITAL_COMMUNITY): Payer: BC Managed Care – PPO

## 2020-03-21 DIAGNOSIS — R519 Headache, unspecified: Secondary | ICD-10-CM | POA: Diagnosis not present

## 2020-03-21 LAB — CBC WITH DIFFERENTIAL/PLATELET
Abs Immature Granulocytes: 0.03 10*3/uL (ref 0.00–0.07)
Basophils Absolute: 0 10*3/uL (ref 0.0–0.1)
Basophils Relative: 0 %
Eosinophils Absolute: 0 10*3/uL (ref 0.0–0.5)
Eosinophils Relative: 0 %
HCT: 43.1 % (ref 36.0–46.0)
Hemoglobin: 14.5 g/dL (ref 12.0–15.0)
Immature Granulocytes: 0 %
Lymphocytes Relative: 24 %
Lymphs Abs: 2 10*3/uL (ref 0.7–4.0)
MCH: 29.9 pg (ref 26.0–34.0)
MCHC: 33.6 g/dL (ref 30.0–36.0)
MCV: 88.9 fL (ref 80.0–100.0)
Monocytes Absolute: 0.6 10*3/uL (ref 0.1–1.0)
Monocytes Relative: 7 %
Neutro Abs: 5.6 10*3/uL (ref 1.7–7.7)
Neutrophils Relative %: 69 %
Platelets: 307 10*3/uL (ref 150–400)
RBC: 4.85 MIL/uL (ref 3.87–5.11)
RDW: 12.7 % (ref 11.5–15.5)
WBC: 8.4 10*3/uL (ref 4.0–10.5)
nRBC: 0 % (ref 0.0–0.2)

## 2020-03-21 LAB — BASIC METABOLIC PANEL WITH GFR
Anion gap: 12 (ref 5–15)
BUN: 9 mg/dL (ref 6–20)
CO2: 23 mmol/L (ref 22–32)
Calcium: 9.5 mg/dL (ref 8.9–10.3)
Chloride: 104 mmol/L (ref 98–111)
Creatinine, Ser: 0.76 mg/dL (ref 0.44–1.00)
GFR, Estimated: >60 mL/min (ref >60)
Glucose, Bld: 94 mg/dL (ref 70–99)
Potassium: 4.1 mmol/L (ref 3.5–5.1)
Sodium: 139 mmol/L (ref 135–145)

## 2020-03-21 LAB — I-STAT BETA HCG BLOOD, ED (MC, WL, AP ONLY): I-stat hCG, quantitative: <5 m[IU]/mL (ref <5)

## 2020-03-21 MED ORDER — METOCLOPRAMIDE HCL 5 MG/ML IJ SOLN
10.0000 mg | Freq: Once | INTRAMUSCULAR | Status: AC
  Administered 2020-03-21: 10 mg via INTRAVENOUS
  Filled 2020-03-21: qty 2

## 2020-03-21 MED ORDER — DIPHENHYDRAMINE HCL 25 MG PO CAPS
25.0000 mg | ORAL_CAPSULE | Freq: Once | ORAL | Status: AC
  Administered 2020-03-21: 25 mg via ORAL
  Filled 2020-03-21: qty 1

## 2020-03-21 MED ORDER — METOCLOPRAMIDE HCL 10 MG PO TABS: 10.0000 mg | ORAL_TABLET | Freq: Three times a day (TID) | ORAL | 0 refills | Status: DC | PRN

## 2020-03-21 MED ORDER — DEXAMETHASONE SODIUM PHOSPHATE 10 MG/ML IJ SOLN
10.0000 mg | Freq: Once | INTRAMUSCULAR | Status: AC
  Administered 2020-03-21: 10 mg via INTRAVENOUS
  Filled 2020-03-21: qty 1

## 2020-03-21 MED ORDER — KETOROLAC TROMETHAMINE 15 MG/ML IJ SOLN
15.0000 mg | Freq: Once | INTRAMUSCULAR | Status: AC
  Administered 2020-03-21: 15 mg via INTRAVENOUS
  Filled 2020-03-21: qty 1

## 2020-03-21 MED ORDER — SODIUM CHLORIDE 0.9 % IV BOLUS
1000.0000 mL | Freq: Once | INTRAVENOUS | Status: AC
  Administered 2020-03-21: 1000 mL via INTRAVENOUS

## 2020-03-21 NOTE — Discharge Instructions (Addendum)
If you have recurring headache, take 3-4 ibuprofen (600-800 mg) with meals and a reglan pill. You can do this up to 3 times a day as needed. Make sure you stay well-hydrated with water. Follow-up with your primary care doctor for further evaluation management of your headaches. Return to the emergency room if you develop high fevers, persistent severe headache, one-sided weakness/numbness, or any new, worsening, or concerning symptoms.

## 2020-03-21 NOTE — ED Provider Notes (Signed)
[facility] EMERGENCY DEPARTMENT Provider Note   CSN: [id number] Arrival date & time: 03/20/20  2057     History Chief Complaint  Patient presents with  . Migraine    Laura Baxter is a 30 y.o. female presenting for evaluation of migraine.  Patient states of the past 2 months, she has had increased incidences of migraine/headache.  It is most likely to occur when she is driving at [facility] or [employer].  Last night she developed a headache that was more severe than previous, and did not improve.  She took 400 mg of ibuprofen last night without improvement of symptoms.  She reports associated photophobia, nausea, vomiting.  She states pain is behind her eyes and at the top of her head.  This is where she normally has the pain.  She reports a history of headaches 15 years ago after concussion, but no significant history of migraine.  She reports mild lower abdominal cramping and decreased urination in the past 12 hours.  No dysuria or hematuria.  She denies vision changes, slurred speech, numbness, weakness, chest pain, shortness breath, abnormal bowel movements.  She takes Synthroid and fluoxetine, neither of these medications are new.  No other recent medication changes.  She is sexually active with 1 female partner, does not use condoms, she states he has had a vasectomy.  She has not had her period since having a vaginal delivery in 08-2019.  Additional history taken chart review.  History of hypothyroidism, ovarian cysts.  HPI     Past Medical History:  Diagnosis Date  . History of febrile seizure    INFANT  . History of hyperthyroidism    DX 2015--  TSH LEVELS NORMALIZED WITHOUT ANY TREATMENT  . History of hypotension   . History of ovarian cyst   . 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 Iron deficiency anemia   . Pilonidal cyst with abscess   . Wears glasses     Patient Active Problem List    Diagnosis Date Noted  . IDA (iron deficiency anemia) 09/08/2019  . Vaginal delivery 7/13 09/06/2019  . Postpartum care following vaginal delivery 7/13 09/06/2019  . Preterm uterine contractions in third trimester, antepartum 07/25/2019  . Hypothyroidism 02/10/2017  . Obstetric vaginal laceration 08/22/2016  . 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 Family history of cleft lip and palate 08/17/2016  . Shellfish allergy 08/17/2016  . 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 PILONIDAL CYST EXCISION N/A 06/17/2017   Procedure: CYST EXCISION PILONIDAL;  Surgeon: Sheliah Hatch, De Blanch, MD;  Location: [facility];  Service: General;  Laterality: N/A;  . SHOULDER SURGERY Left 2012   capsular repair     OB History    Gravida  3   Para  2   Term  2   Preterm      AB  1   Living  2     SAB  1   IAB      Ectopic      Multiple  0   Live Births  2           Family History  Problem Relation Age of Onset  . Depression Mother   . Anemia Mother   . Heart attack Father   . Hypertension Father   . Hyperlipidemia Father   . Heart disease Father   . Depression Father   .  Glaucoma Father   . Graves' disease Father   . Thyroid disease Father   . Osteoporosis Father   . Cancer Maternal Grandmother        pancreatic  . Breast cancer Paternal Grandmother   . Leukemia Paternal Grandmother   . Leukemia Paternal Grandfather     Social History   Tobacco Use  . Smoking status: Never Smoker  . Smokeless tobacco: Never Used  Vaping Use  . Vaping Use: Never used  Substance Use Topics  . Alcohol use: No  . Drug use: No    Home Medications Prior to Admission medications   Medication Sig Start Date End Date Taking? Authorizing Provider  metoCLOPramide (REGLAN) 10 MG tablet Take 1 tablet (10 mg total) by mouth every 8 (eight) hours as needed for nausea. 03/21/20  Yes Sabra Sessler, PA-C  FLUoxetine (PROZAC) 20  MG capsule Take 1 capsule (20 mg total) by mouth daily. 09/08/19 03/06/20  Roma Schanz, CNM  iron polysaccharides (NIFEREX) 150 MG capsule Take 1 capsule (150 mg total) by mouth daily. 09/09/19 01/07/20  Roma Schanz, CNM  levothyroxine (SYNTHROID) 100 MCG tablet Take 100 mcg by mouth daily before breakfast.    [provider]    Allergies    Iodine, Shellfish allergy, and Latex  Review of Systems   Review of Systems  Gastrointestinal: Positive for abdominal pain (lower abd cramping), nausea and vomiting.  Genitourinary: Positive for decreased urine volume.  Neurological: Positive for headaches.  All other systems reviewed and are negative.   Physical Exam Updated Vital Signs BP 100/64 (BP Location: Left Arm)   Pulse 63   Temp 98.4 F (36.9 C) (Oral)   Resp 18   Ht 5\' 4"  (1.626 m)   Wt 67.7 kg   SpO2 98%   BMI 25.62 kg/m   Physical Exam Vitals and nursing note reviewed.  Constitutional:      General: She is not in acute distress.    Appearance: She is well-developed and well-nourished.     Comments: Appears uncomfortable due to pain. Otherwise nontoxic  HENT:     Head: Normocephalic and atraumatic.  Eyes:     Extraocular Movements: Extraocular movements intact and EOM normal.     Conjunctiva/sclera: Conjunctivae normal.     Pupils: Pupils are equal, round, and reactive to light.  Neck:     Comments: No neck rigidity Cardiovascular:     Rate and Rhythm: Normal rate and regular rhythm.     Pulses: Normal pulses and intact distal pulses.  Pulmonary:     Effort: Pulmonary effort is normal. No respiratory distress.     Breath sounds: Normal breath sounds. No wheezing.  Abdominal:     General: There is no distension.     Palpations: Abdomen is soft. There is no mass.     Tenderness: There is no abdominal tenderness. There is no guarding or rebound.  Musculoskeletal:        General: Normal range of motion.     Cervical back: Normal range of motion and neck  supple.     Comments: Strength and sensation intact x4. Ambulatory without difficulty   Skin:    General: Skin is warm and dry.     Capillary Refill: Capillary refill takes less than 2 seconds.  Neurological:     Mental Status: She is alert and oriented to person, place, and time.     GCS: GCS eye subscore is 4. GCS verbal subscore is 5. GCS motor  subscore is 6.     Cranial Nerves: Cranial nerves are intact.     Sensory: Sensation is intact.     Motor: Motor function is intact.     Coordination: Coordination is intact.     Gait: Gait is intact.     Comments: No gross neurologic deficit.  strength and sensation intact x4.  CN intact.  Psychiatric:        Mood and Affect: Mood and affect normal.     ED Results / Procedures / Treatments   Labs (all labs ordered are listed, but only abnormal results are displayed) Labs Reviewed  CBC WITH DIFFERENTIAL/PLATELET  BASIC METABOLIC PANEL  URINALYSIS, ROUTINE W REFLEX MICROSCOPIC  I-STAT BETA HCG BLOOD, ED ([facility], [facility], [facility] ONLY)    EKG None  Radiology CT Head Wo Contrast  Result Date: 03/21/2020 CLINICAL DATA:  Headache, intracranial hemorrhage suspected EXAM: CT HEAD WITHOUT CONTRAST TECHNIQUE: Contiguous axial images were obtained from the base of the skull through the vertex without intravenous contrast. COMPARISON:  None. FINDINGS: Brain: No acute infarct or intracranial hemorrhage. No mass lesion. No midline shift, ventriculomegaly or extra-axial fluid collection. Vascular: No hyperdense vessel or unexpected calcification. Skull: Negative for fracture or focal lesion. Sinuses/Orbits: Normal orbits. Clear paranasal sinuses. No mastoid effusion. Other: None. IMPRESSION: No acute intracranial process. Electronically Signed   By: Stana Bunting M.D.   On: 03/21/2020 08:34    Procedures Procedures   Medications Ordered in ED Medications  oxyCODONE-acetaminophen (PERCOCET/ROXICET) 5-325 MG per tablet 1 tablet (1 tablet Oral Given  03/20/20 2122)  sodium chloride 0.9 % bolus 1,000 mL (1,000 mLs Intravenous New Bag/Given 03/21/20 0911)  ketorolac (TORADOL) 15 MG/ML injection 15 mg (15 mg Intravenous Given 03/21/20 0911)  metoCLOPramide (REGLAN) injection 10 mg (10 mg Intravenous Given 03/21/20 0911)  diphenhydrAMINE (BENADRYL) capsule 25 mg (25 mg Oral Given 03/21/20 0912)  dexamethasone (DECADRON) injection 10 mg (10 mg Intravenous Given 03/21/20 0911)    ED Course  I have reviewed the triage vital signs and the nursing notes.  Pertinent labs & imaging results that were available during my care of the patient were reviewed by me and considered in my medical decision making (see chart for details).    MDM Rules/Calculators/A&P                          Patient presenting for evaluation of headache.  I saw patient approximately 10-1/2 hours after her arrival to the ER.  On exam, patient appears uncomfortable due to pain, however otherwise nontoxic.  No obvious neurologic deficits.  However, patient has had increasing headaches in the past 2 months, with last night's headache being severe and long-lasting.  Will treat with headache cocktail.  Also consider underlying metabolic cause.  As patient has lower abdominal cramping and decreased urination, will check urine and pregnancy test.  As symptoms have been present for 2 months, will obtain head CT to ensure no obvious intracranial abnormality, although low suspicion for this at this time.  On reassessment, patient reports complete resolution of headache, nausea, vomiting.  She feels well at this time.  Head CT negative for acute findings.  Labs interpreted by me, overall reassuring.  No anemia.  No significant electrolyte abnormality.  Discussed findings with patient.  Discussed follow-up with primary care if headaches persist, she may need to be on preventative medicine.  Discussed abortive techniques including ibuprofen and Reglan.  At this time, patient appears safe for  discharge.   Return precautions given.  Patient states she understands and agrees to plan.  Final Clinical Impression(s) / ED Diagnoses Final diagnoses:  Bad headache    Rx / DC Orders ED Discharge Orders         Ordered    metoCLOPramide (REGLAN) 10 MG tablet  Every 8 hours PRN        03/21/20 1013           Tarren Velardi, PA-C 03/21/20 1042    Horton, Clabe Seal, DO 03/22/20 0825

## 2020-03-21 NOTE — ED Notes (Signed)
Patient transported to CT 

## 2020-04-16 DIAGNOSIS — F4322 Adjustment disorder with anxiety: Secondary | ICD-10-CM | POA: Diagnosis not present

## 2020-04-16 DIAGNOSIS — F53 Postpartum depression: Secondary | ICD-10-CM | POA: Diagnosis not present

## 2020-04-19 ENCOUNTER — Ambulatory Visit: Payer: Self-pay | Admitting: Endocrinology

## 2020-04-19 ENCOUNTER — Telehealth: Payer: Self-pay | Admitting: Endocrinology

## 2020-04-19 DIAGNOSIS — F331 Major depressive disorder, recurrent, moderate: Secondary | ICD-10-CM | POA: Diagnosis not present

## 2020-04-19 DIAGNOSIS — F53 Postpartum depression: Secondary | ICD-10-CM | POA: Diagnosis not present

## 2020-04-19 NOTE — Telephone Encounter (Signed)
MEDICATION: levothyroxine  PHARMACY:   [facility] - [place], [place] - [address] Phone:  [phone]  Fax:  [phone]     HAS THE PATIENT CONTACTED THEIR PHARMACY?  no  IS THIS A 90 DAY SUPPLY : yes  IS PATIENT OUT OF MEDICATION: no  IF NOT; HOW MUCH IS LEFT: only a few  LAST APPOINTMENT DATE: @11 /19/2021  NEXT APPOINTMENT DATE:@3 /24/2022  DO WE HAVE YOUR PERMISSION TO LEAVE A DETAILED MESSAGE?:   **Let patient know to contact pharmacy at the end of the day to make sure medication is ready. **  ** Please notify patient to allow 48-72 hours to process**  **Encourage patient to contact the pharmacy for refills or they can request refills through [facility]**

## 2020-04-21 ENCOUNTER — Other Ambulatory Visit: Payer: Self-pay

## 2020-04-21 DIAGNOSIS — E039 Hypothyroidism, unspecified: Secondary | ICD-10-CM

## 2020-04-21 MED ORDER — LEVOTHYROXINE SODIUM 100 MCG PO TABS: 100.0000 ug | ORAL_TABLET | Freq: Every day | ORAL | 3 refills | Status: DC

## 2020-04-21 NOTE — Telephone Encounter (Signed)
Spoke with pt to let her know that her medication has been sent to pharmacy.

## 2020-04-23 DIAGNOSIS — F4322 Adjustment disorder with anxiety: Secondary | ICD-10-CM | POA: Diagnosis not present

## 2020-05-02 DIAGNOSIS — Z0000 Encounter for general adult medical examination without abnormal findings: Secondary | ICD-10-CM | POA: Diagnosis not present

## 2020-05-02 DIAGNOSIS — E039 Hypothyroidism, unspecified: Secondary | ICD-10-CM | POA: Diagnosis not present

## 2020-05-02 DIAGNOSIS — Z13220 Encounter for screening for lipoid disorders: Secondary | ICD-10-CM | POA: Diagnosis not present

## 2020-05-02 LAB — TSH: TSH: 0.51 (ref <=5.90)

## 2020-05-03 DIAGNOSIS — F4322 Adjustment disorder with anxiety: Secondary | ICD-10-CM | POA: Diagnosis not present

## 2020-05-09 DIAGNOSIS — F4322 Adjustment disorder with anxiety: Secondary | ICD-10-CM | POA: Diagnosis not present

## 2020-05-16 DIAGNOSIS — F53 Postpartum depression: Secondary | ICD-10-CM | POA: Diagnosis not present

## 2020-05-17 ENCOUNTER — Other Ambulatory Visit: Payer: Self-pay

## 2020-05-17 ENCOUNTER — Encounter: Payer: Self-pay | Admitting: Endocrinology

## 2020-05-17 ENCOUNTER — Ambulatory Visit: Payer: BC Managed Care – PPO | Admitting: Endocrinology

## 2020-05-17 VITALS — BP 120/78 | HR 76 | Ht 64.0 in | Wt 155.0 lb

## 2020-05-17 DIAGNOSIS — F4322 Adjustment disorder with anxiety: Secondary | ICD-10-CM | POA: Diagnosis not present

## 2020-05-17 DIAGNOSIS — E039 Hypothyroidism, unspecified: Secondary | ICD-10-CM | POA: Diagnosis not present

## 2020-05-17 NOTE — Progress Notes (Signed)
Subjective:    Patient ID: Laura Baxter, female    DOB: 03/03/1990, 30 y.o.   MRN: [id number]  HPI Pt returns for f/u of hypothyroidism (dx'ed 2015; she cycled twice between hyper- and hypothyroidism until 2018, when she developed her current hypothyroidism; she takes synthroid; US showed thyroiditis; she had a normal ACTH stim test in 2019).  She is 8 months postpartun.  pt states she feels well in general, except for mild anxiety and depression.  She lost pregnancy weight, but has since regained a few lbs.  husb has had vasectomy.  She has finished breast feeding.   Past Medical History:  Diagnosis Date  . History of febrile seizure    INFANT  . History of hyperthyroidism    DX 2015--  TSH LEVELS NORMALIZED WITHOUT ANY TREATMENT  . History of hypotension   . History of ovarian cyst   . 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 Iron deficiency anemia   . Pilonidal cyst with abscess   . Wears glasses     Past Surgical History:  Procedure Laterality Date  . PILONIDAL CYST EXCISION N/A 06/17/2017   Procedure: CYST EXCISION PILONIDAL;  Surgeon: Sheliah Hatch, De Blanch, MD;  Location: [facility];  Service: General;  Laterality: N/A;  . SHOULDER SURGERY Left 2012   capsular repair    Social History   Socioeconomic History  . Marital status: Married    Spouse name: Not on file  . Number of children: Not on file  . Years of education: Not on file  . Highest education level: Not on file  Occupational History  . Not on file  Tobacco Use  . Smoking status: Never Smoker  . Smokeless tobacco: Never Used  Vaping Use  . Vaping Use: Never used  Substance and Sexual Activity  . Alcohol use: No  . Drug use: No  . Sexual activity: Yes    Comment: IUD placed 02/ 2019 previously  Other Topics Concern  . Not on file  Social History Narrative  . Not on file   Social Determinants of Health   Financial  Resource Strain: Not on file  Food Insecurity: Not on file  Transportation Needs: Not on file  Physical Activity: Not on file  Stress: Not on file  Social Connections: Not on file  Intimate Partner Violence: Not on file    Current Outpatient Medications on File Prior to Visit  Medication Sig Dispense Refill  . lamoTRIgine (LAMICTAL) 25 MG tablet Take by mouth.    . levothyroxine (SYNTHROID) 100 MCG tablet Take 1 tablet (100 mcg total) by mouth daily before breakfast. 90 tablet 3  . FLUoxetine (PROZAC) 20 MG capsule Take 1 capsule (20 mg total) by mouth daily. 90 capsule 1   No current facility-administered medications on file prior to visit.    Allergies  Allergen Reactions  . Iodine Rash  . Shellfish Allergy Rash  . Latex Itching    Power in the gloves cause redness    Family History  Problem Relation Age of Onset  . Depression Mother   . Anemia Mother   . Heart attack Father   . Hypertension Father   . Hyperlipidemia Father   . Heart disease Father   . Depression Father   . Glaucoma Father   . Graves' disease Father   . Thyroid disease Father   . Osteoporosis Father   . Cancer Maternal Grandmother  pancreatic  . Breast cancer Paternal Grandmother   . Leukemia Paternal Grandmother   . Leukemia Paternal Grandfather     BP 120/78 (BP Location: Left Arm, Patient Position: Sitting, Cuff Size: Normal)   Pulse 76   Ht 5\' 4"  (1.626 m)   Wt 155 lb (70.3 kg)   SpO2 99%   BMI 26.61 kg/m    Review of Systems     Objective:   Physical Exam VITAL SIGNS:  See vs page.   GENERAL: no distress Pulses: dorsalis pedis intact bilat.   MSK: no deformity of the feet CV: no leg edema.   Skin:  no ulcer on the feet.  normal color and temp on the feet. Neuro: sensation is intact to touch on the feet  outside test results are reviewed: TSH=0.5     Assessment & Plan:  Hypothyroidism: well-replaced.  Patient Instructions  Please continue the same levothyroxine.   Please come back for a follow-up appointment in 1 year.

## 2020-05-17 NOTE — Patient Instructions (Addendum)
Please continue the same levothyroxine.   Please come back for a follow-up appointment in 1 year.   

## 2020-05-18 ENCOUNTER — Encounter: Payer: Self-pay | Admitting: Endocrinology

## 2020-05-23 DIAGNOSIS — F4322 Adjustment disorder with anxiety: Secondary | ICD-10-CM | POA: Diagnosis not present

## 2020-06-04 DIAGNOSIS — F4322 Adjustment disorder with anxiety: Secondary | ICD-10-CM | POA: Diagnosis not present

## 2020-06-18 DIAGNOSIS — F53 Postpartum depression: Secondary | ICD-10-CM | POA: Diagnosis not present

## 2020-06-20 DIAGNOSIS — F4322 Adjustment disorder with anxiety: Secondary | ICD-10-CM | POA: Diagnosis not present

## 2020-07-03 DIAGNOSIS — F4322 Adjustment disorder with anxiety: Secondary | ICD-10-CM | POA: Diagnosis not present

## 2020-07-17 DIAGNOSIS — F4322 Adjustment disorder with anxiety: Secondary | ICD-10-CM | POA: Diagnosis not present

## 2020-08-06 DIAGNOSIS — F53 Postpartum depression: Secondary | ICD-10-CM | POA: Diagnosis not present

## 2020-08-13 DIAGNOSIS — F4322 Adjustment disorder with anxiety: Secondary | ICD-10-CM | POA: Diagnosis not present

## 2020-09-17 DIAGNOSIS — F53 Postpartum depression: Secondary | ICD-10-CM | POA: Diagnosis not present

## 2020-09-19 DIAGNOSIS — R0602 Shortness of breath: Secondary | ICD-10-CM | POA: Diagnosis not present

## 2020-09-19 DIAGNOSIS — R0981 Nasal congestion: Secondary | ICD-10-CM | POA: Diagnosis not present

## 2020-09-19 DIAGNOSIS — R059 Cough, unspecified: Secondary | ICD-10-CM | POA: Diagnosis not present

## 2020-09-19 DIAGNOSIS — B349 Viral infection, unspecified: Secondary | ICD-10-CM | POA: Diagnosis not present

## 2020-10-03 DIAGNOSIS — F4322 Adjustment disorder with anxiety: Secondary | ICD-10-CM | POA: Diagnosis not present

## 2020-10-16 DIAGNOSIS — F4322 Adjustment disorder with anxiety: Secondary | ICD-10-CM | POA: Diagnosis not present

## 2020-10-22 DIAGNOSIS — F4322 Adjustment disorder with anxiety: Secondary | ICD-10-CM | POA: Diagnosis not present

## 2020-11-19 DIAGNOSIS — F4322 Adjustment disorder with anxiety: Secondary | ICD-10-CM | POA: Diagnosis not present

## 2020-12-02 ENCOUNTER — Emergency Department (HOSPITAL_COMMUNITY)
Admission: EM | Admit: 2020-12-02 | Discharge: 2020-12-02 | Disposition: A | Discharge status: Home / Self Care | Payer: Self-pay | Attending: Emergency Medicine | Admitting: Emergency Medicine

## 2020-12-02 ENCOUNTER — Emergency Department (HOSPITAL_COMMUNITY): Payer: Self-pay

## 2020-12-02 ENCOUNTER — Encounter (HOSPITAL_COMMUNITY): Payer: Self-pay | Admitting: Emergency Medicine

## 2020-12-02 ENCOUNTER — Other Ambulatory Visit: Payer: Self-pay

## 2020-12-02 DIAGNOSIS — N3000 Acute cystitis without hematuria: Secondary | ICD-10-CM | POA: Insufficient documentation

## 2020-12-02 DIAGNOSIS — Z79899 Other long term (current) drug therapy: Secondary | ICD-10-CM | POA: Insufficient documentation

## 2020-12-02 DIAGNOSIS — N9489 Other specified conditions associated with female genital organs and menstrual cycle: Secondary | ICD-10-CM | POA: Insufficient documentation

## 2020-12-02 DIAGNOSIS — E039 Hypothyroidism, unspecified: Secondary | ICD-10-CM | POA: Insufficient documentation

## 2020-12-02 DIAGNOSIS — R1030 Lower abdominal pain, unspecified: Secondary | ICD-10-CM

## 2020-12-02 DIAGNOSIS — Z91040 Latex allergy status: Secondary | ICD-10-CM | POA: Insufficient documentation

## 2020-12-02 LAB — CBC WITH DIFFERENTIAL/PLATELET
Abs Immature Granulocytes: 0.04 10*3/uL (ref 0.00–0.07)
Basophils Absolute: 0.1 10*3/uL (ref 0.0–0.1)
Basophils Relative: 1 %
Eosinophils Absolute: 0.1 10*3/uL (ref 0.0–0.5)
Eosinophils Relative: 1 %
HCT: 43.3 % (ref 36.0–46.0)
Hemoglobin: 13.7 g/dL (ref 12.0–15.0)
Immature Granulocytes: 0 %
Lymphocytes Relative: 17 %
Lymphs Abs: 1.5 10*3/uL (ref 0.7–4.0)
MCH: 28.5 pg (ref 26.0–34.0)
MCHC: 31.6 g/dL (ref 30.0–36.0)
MCV: 90 fL (ref 80.0–100.0)
Monocytes Absolute: 0.7 10*3/uL (ref 0.1–1.0)
Monocytes Relative: 8 %
Neutro Abs: 6.6 10*3/uL (ref 1.7–7.7)
Neutrophils Relative %: 73 %
Platelets: 333 10*3/uL (ref 150–400)
RBC: 4.81 MIL/uL (ref 3.87–5.11)
RDW: 12.8 % (ref 11.5–15.5)
WBC: 9 10*3/uL (ref 4.0–10.5)
nRBC: 0 % (ref 0.0–0.2)

## 2020-12-02 LAB — LIPASE, BLOOD: Lipase: 31 U/L (ref 11–51)

## 2020-12-02 LAB — COMPREHENSIVE METABOLIC PANEL
ALT: 15 U/L (ref 0–44)
AST: 15 U/L (ref 15–41)
Albumin: 3.7 g/dL (ref 3.5–5.0)
Alkaline Phosphatase: 51 U/L (ref 38–126)
Anion gap: 8 (ref 5–15)
BUN: 8 mg/dL (ref 6–20)
CO2: 28 mmol/L (ref 22–32)
Calcium: 9.3 mg/dL (ref 8.9–10.3)
Chloride: 105 mmol/L (ref 98–111)
Creatinine, Ser: 0.86 mg/dL (ref 0.44–1.00)
GFR, Estimated: >60 mL/min (ref >60)
Glucose, Bld: 99 mg/dL (ref 70–99)
Potassium: 4.2 mmol/L (ref 3.5–5.1)
Sodium: 141 mmol/L (ref 135–145)
Total Bilirubin: 0.8 mg/dL (ref 0.3–1.2)
Total Protein: 6.9 g/dL (ref 6.5–8.1)

## 2020-12-02 LAB — URINALYSIS, ROUTINE W REFLEX MICROSCOPIC
Bilirubin Urine: NEGATIVE
Glucose, UA: NEGATIVE mg/dL
Hgb urine dipstick: NEGATIVE
Ketones, ur: NEGATIVE mg/dL
Nitrite: NEGATIVE
Protein, ur: NEGATIVE mg/dL
Specific Gravity, Urine: 1.011 (ref 1.005–1.030)
pH: 7 (ref 5.0–8.0)

## 2020-12-02 LAB — I-STAT BETA HCG BLOOD, ED (MC, WL, AP ONLY): I-stat hCG, quantitative: <5 m[IU]/mL (ref <5)

## 2020-12-02 MED ORDER — SULFAMETHOXAZOLE-TRIMETHOPRIM 800-160 MG PO TABS: 1.0000 | ORAL_TABLET | Freq: Two times a day (BID) | ORAL | 0 refills | Status: AC

## 2020-12-02 NOTE — ED Provider Notes (Signed)
Emergency Medicine Provider Triage Evaluation Note  Laura Baxter , a 30 y.o. female  was evaluated in triage.  Pt complains of lower abdominal pain for the past 2 days.  Describes as a cramping sensation to the lower abdomen however and now radiates onto her back.  She reports she was treating herself for UTI, with some over-the-counter Azo's.  Menstrual period approximately 2 weeks ago, this was irregular according to patient.  No nausea, no vomiting, no diarrhea or fevers  Review of Systems  Positive: Lower abdominal pain, flank pain Negative: Fever, vomiting, nausea  Physical Exam  There were no vitals taken for this visit. Gen:   Awake, no distress   Resp:  Normal effort  MSK:   Moves extremities without difficulty  Other:  Bilateral CVA tenderness.  Pain reproducible with palpation of the lower abdomen.  Clear to auscultation.  Medical Decision Making  Medically screening exam initiated at 9:11 AM.  Appropriate orders placed.  Laura Baxter was informed that the remainder of the evaluation will be completed by another provider, this initial triage assessment does not replace that evaluation, and the importance of remaining in the ED until their evaluation is complete.  Patient here with flank pain, lower abdominal pain that began 2 days ago.  Was treating herself at home with an Azo's for UTI.  Afebrile on arrival, no nausea or vomiting.  Last menstrual period 2 weeks ago.  Some suspicion for pyelonephritis versus infected stone.  Labs along with CT renal have been ordered   Claude Manges, PA-C 12/02/20 6440    Gerhard Munch, MD 12/02/20 1650

## 2020-12-02 NOTE — ED Provider Notes (Signed)
[facility] EMERGENCY DEPARTMENT Provider Note   CSN: [id number] Arrival date & time: 12/02/20  0857     History Chief Complaint  Patient presents with   Abdominal Pain    Laura Baxter is a 30 y.o. female.  30 year old female with a past medical history of ovarian cyst presents to the ED with a chief complaint of lower abdominal cramping for the past 2 days.  Reports the pain now radiates onto her back.  She thought that she likely had a UTI, was treating this with Azo's however the pain has worsened.  In addition, she does have a history of ovarian cyst, however pain seems to be persistent throughout the whole lower abdominal area.  The history is provided by the patient and medical records.  Abdominal Pain Pain location:  Suprapubic Pain quality: aching   Pain radiates to:  R flank Pain severity:  Moderate Onset quality:  Gradual Duration:  2 days Progression:  Worsening Chronicity:  New Context: not alcohol use and not diet changes   Associated symptoms: no chest pain, no chills, no diarrhea, no fever, no nausea, no shortness of breath, no sore throat and no vomiting       Past Medical History:  Diagnosis Date   History of febrile seizure    INFANT   History of hyperthyroidism    DX 2015--  TSH LEVELS NORMALIZED WITHOUT ANY TREATMENT   History of hypotension    History of ovarian cyst    Hypothyroidism, secondary ENDOCRINOLOGIST-  DR Everardo All   SECONDARY TO LYMPHOCYTIC THYROIDITIS   Iron deficiency anemia    Pilonidal cyst with abscess    Wears glasses     Patient Active Problem List   Diagnosis Date Noted   IDA (iron deficiency anemia) 09/08/2019   Vaginal delivery 7/13 09/06/2019   Postpartum care following vaginal delivery 7/13 09/06/2019   Preterm uterine contractions in third trimester, antepartum 07/25/2019   Hypothyroidism 02/10/2017   Obstetric vaginal laceration 08/22/2016   Club foot--family history 08/17/2016   Family  history of cleft lip and palate 08/17/2016   Shellfish allergy 08/17/2016   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    Past Surgical History:  Procedure Laterality Date   PILONIDAL CYST EXCISION N/A 06/17/2017   Procedure: CYST EXCISION PILONIDAL;  Surgeon: Sheliah Hatch De Blanch, MD;  Location: [facility];  Service: General;  Laterality: N/A;   SHOULDER SURGERY Left 2012   capsular repair     OB History     Gravida  3   Para  2   Term  2   Preterm      AB  1   Living  2      SAB  1   IAB      Ectopic      Multiple  0   Live Births  2           Family History  Problem Relation Age of Onset   Depression Mother    Anemia Mother    Heart attack Father    Hypertension Father    Hyperlipidemia Father    Heart disease Father    Depression Father    Glaucoma Father    Graves' disease Father    Thyroid disease Father    Osteoporosis Father    Cancer Maternal Grandmother        pancreatic   Breast cancer Paternal Grandmother    Leukemia Paternal Grandmother    Leukemia Paternal Grandfather  Social History   Tobacco Use   Smoking status: Never   Smokeless tobacco: Never  Vaping Use   Vaping Use: Never used  Substance Use Topics   Alcohol use: No   Drug use: No    Home Medications Prior to Admission medications   Medication Sig Start Date End Date Taking? Authorizing Provider  sulfamethoxazole-trimethoprim (BACTRIM DS) 800-160 MG tablet Take 1 tablet by mouth 2 (two) times daily for 7 days. 12/02/20 12/09/20 Yes Zealand Boyett, PA-C  FLUoxetine (PROZAC) 20 MG capsule Take 1 capsule (20 mg total) by mouth daily. 09/08/19 03/06/20  Roma Schanz, CNM  lamoTRIgine (LAMICTAL) 25 MG tablet Take by mouth. 04/26/20   [provider]  levothyroxine (SYNTHROID) 100 MCG tablet Take 1 tablet (100 mcg total) by mouth daily before breakfast. 04/21/20   Romero Belling, MD    Allergies    Iodine, Shellfish allergy, and Latex  Review of Systems    Review of Systems  Constitutional:  Negative for chills and fever.  HENT:  Negative for sore throat.   Respiratory:  Negative for shortness of breath.   Cardiovascular:  Negative for chest pain.  Gastrointestinal:  Positive for abdominal pain. Negative for diarrhea, nausea and vomiting.  Genitourinary:  Positive for flank pain.  Musculoskeletal:  Negative for back pain.  Skin:  Negative for pallor and wound.  Neurological:  Negative for light-headedness.  All other systems reviewed and are negative.  Physical Exam Updated Vital Signs BP 107/74 (BP Location: Left Arm)   Pulse 83   Temp 98.4 F (36.9 C) (Oral)   Resp 18   LMP 11/18/2020   SpO2 100%   Physical Exam Vitals and nursing note reviewed.  Constitutional:      General: She is not in acute distress.    Appearance: She is well-developed.  HENT:     Head: Normocephalic and atraumatic.     Mouth/Throat:     Pharynx: No oropharyngeal exudate.  Eyes:     Pupils: Pupils are equal, round, and reactive to light.  Cardiovascular:     Rate and Rhythm: Regular rhythm.     Heart sounds: Normal heart sounds.  Pulmonary:     Effort: Pulmonary effort is normal. No respiratory distress.     Breath sounds: Normal breath sounds.  Abdominal:     General: Bowel sounds are normal. There is no distension.     Palpations: Abdomen is soft.     Tenderness: There is abdominal tenderness in the suprapubic area. There is right CVA tenderness.  Musculoskeletal:        General: No tenderness or deformity.     Cervical back: Normal range of motion.     Right lower leg: No edema.     Left lower leg: No edema.  Skin:    General: Skin is warm and dry.  Neurological:     Mental Status: She is alert and oriented to person, place, and time.    ED Results / Procedures / Treatments   Labs (all labs ordered are listed, but only abnormal results are displayed) Labs Reviewed  URINALYSIS, ROUTINE W REFLEX MICROSCOPIC - Abnormal; Notable for  the following components:      Result Value   APPearance HAZY (*)    Leukocytes,Ua MODERATE (*)    Bacteria, UA RARE (*)    Non Squamous Epithelial 0-5 (*)    All other components within normal limits  URINE CULTURE  CBC WITH DIFFERENTIAL/PLATELET  COMPREHENSIVE METABOLIC PANEL  LIPASE,  BLOOD  I-STAT BETA HCG BLOOD, ED ([facility], [facility], [facility] ONLY)    EKG None  Radiology CT Renal Stone Study  Result Date: 12/02/2020 CLINICAL DATA:  30 year old female with lower abdominal pain for 2 days radiating to the back. EXAM: CT ABDOMEN AND PELVIS WITHOUT CONTRAST TECHNIQUE: Multidetector CT imaging of the abdomen and pelvis was performed following the standard protocol without IV contrast. COMPARISON:  None. FINDINGS: Lower chest: Negative. Hepatobiliary: Negative noncontrast liver and gallbladder. Pancreas: Negative. Spleen: Negative. Adrenals/Urinary Tract: Normal adrenal glands. No nephrolithiasis. No pararenal inflammation. Negative noncontrast left kidney and course of the left ureter. Incidental left hemipelvis phleboliths. Noncontrast right kidney and ureter also within normal limits. No evidence of distal hydroureter. Decompressed bladder, although does appear indistinct with evidence of perivesical mild perivesical inflammatory stranding (such as coronal image 33). Stomach/Bowel: Decompressed and negative distal large bowel. Small coarsely calcified postinflammatory mesenteric lymph node or less likely diverticula at the junction of the descending and sigmoid colon on series 3, image 57. No large bowel inflammation, normal appendix on series 3, image 54. Average volume of retained stool. Terminal ileum is decompressed and negative. No dilated small bowel. Evidence of a small gas containing duodenal diverticulum on series 3, image 34. Otherwise negative stomach and duodenum. No active inflammation. No free air or free fluid. Vascular/Lymphatic: Normal caliber abdominal aorta. No atherosclerosis or  lymphadenopathy identified. Reproductive: Negative noncontrast appearance. Other: No pelvic free fluid. Musculoskeletal: Negative. IMPRESSION: 1. Contracted bladder with evidence of perivesical inflammatory stranding compatible with acute UTI and/or Cystitis. 2. Pelvic phleboliths.  No urinary calculus or obstructive uropathy. 3. Normal appendix. Incidental small duodenum diverticulum suspected. Electronically Signed   By: Odessa Fleming M.D.   On: 12/02/2020 10:41    Procedures Procedures   Medications Ordered in ED Medications - No data to display  ED Course  I have reviewed the triage vital signs and the nursing notes.  Pertinent labs & imaging results that were available during my care of the patient were reviewed by me and considered in my medical decision making (see chart for details).    MDM Rules/Calculators/A&P    Patient presents to the ED with lower abdominal pain along with right flank pain that is been ongoing for the past 3 days.  Patient reports she was treating her UTI symptoms at home with over-the-counter Azo's without any relief.  On today's visit she reports worsening pain along with pain radiating to bilateral flanks.  No vaginal bleeding, no vaginal discharge.  Her last menstrual period was on November 18, 2020.  Evaluation she is overall nontoxic-appearing, vitals are within normal limits.  Abdomen is soft, tender to palpation along the whole lower abdominal area without any focal point of tenderness to the right or left.  CVA tenderness bilaterally is present.  The rest of her exam is benign with lungs clear to auscultation.  Interpretation of her labs with a CMP with no electrolyte derangement, her creatinine levels within normal limits.  LFTs are unremarkable.  CBC with no leukocytosis, globin is stable.  Lipase level is normal.  hCG is negative.  Urinalysis with moderate leukocytes, 21-50 white blood cell count, rare bacteria this was also sent for culture.  Some suspicion  for acute cystitis versus pyelonephritis versus infected stone.  CT renal stone showed:  1. Contracted bladder with evidence of perivesical inflammatory  stranding compatible with acute UTI and/or Cystitis.  2. Pelvic phleboliths.  No urinary calculus or obstructive uropathy.  3. Normal appendix. Incidental small  duodenum diverticulum  suspected.         Extensive review of her records, including prior urine cultures, without any prior specimen that has grown.  Due to patient's new back pain, will treat for pyelonephritis at this time.  She will be placed in a prescription for Bactrim for the next 7 days.  She is agreeable with Daryl Eastern and treatment at this time.  Due to our long ED  WR wait times, patient was discharged from triage room at this time.   Portions of this note were generated with Scientist, clinical (histocompatibility and immunogenetics). Dictation errors may occur despite best attempts at proofreading.  Final Clinical Impression(s) / ED Diagnoses Final diagnoses:  Lower abdominal pain  Acute cystitis without hematuria    Rx / DC Orders ED Discharge Orders          Ordered    sulfamethoxazole-trimethoprim (BACTRIM DS) 800-160 MG tablet  2 times daily        12/02/20 1121             Claude Manges, PA-C 12/02/20 1123    Gerhard Munch, MD 12/02/20 1650

## 2020-12-02 NOTE — ED Notes (Signed)
Discharged by PA at triage. 

## 2020-12-02 NOTE — ED Triage Notes (Signed)
C/o lower abd pain x 2 days that now radiates to back.  Treating herself for a UTI with AZO.   Denies nausea, vomiting, and diarrhea.

## 2020-12-02 NOTE — Discharge Instructions (Addendum)
We discussed the results of your labs along with CT at length.  You were given a prescription for antibiotics.  Please take 1 tablet twice a day for the next 7 days.  If you experience fever, worsening symptoms, vomiting you will need to return to the emergency department.

## 2020-12-04 LAB — URINE CULTURE: Culture: >=100000 — AB

## 2020-12-05 ENCOUNTER — Telehealth: Payer: Self-pay | Admitting: *Deleted

## 2020-12-05 NOTE — Telephone Encounter (Signed)
Post ED Visit - Positive Culture Follow-up  Culture report reviewed by antimicrobial stewardship pharmacist: [facility] []  , Pharm.D. []  Enzo Bi, Pharm.D., BCPS AQ-ID []  , Pharm.D., BCPS []  Celedonio Miyamoto, Pharm.D., BCPS []  [place], Garvin Fila.D., BCPS, AAHIVP []  , Pharm.D., BCPS, AAHIVP []  Georgina Pillion, PharmD, BCPS []  , PharmD, BCPS []  [place], PharmD, BCPS []  [address], PharmD []  , PharmD, BCPS []  Estella Husk, PharmD  Pharmacy Team []  Lysle Pearl, PharmD []  , PharmD []  Phillips Climes, PharmD []  , Rph []  Agapito Games) , PharmD []  Verlan Friends, PharmD []  , PharmD []  Mervyn Gay, PharmD []  , PharmD []  Vinnie Level, PharmD []  [facility], PharmD []  , PharmD []  Len Childs, PharmD   Positive urine culture Treated with Sulfamethoxazole-Trimethoprim, organism sensitive to the same and no further patient follow-up is required at this time.  , PharmD  Greer Pickerel Talley 12/05/2020, 9:36 AM

## 2020-12-17 DIAGNOSIS — F330 Major depressive disorder, recurrent, mild: Secondary | ICD-10-CM | POA: Diagnosis not present

## 2020-12-17 DIAGNOSIS — F419 Anxiety disorder, unspecified: Secondary | ICD-10-CM | POA: Diagnosis not present

## 2021-01-02 DIAGNOSIS — F4322 Adjustment disorder with anxiety: Secondary | ICD-10-CM | POA: Diagnosis not present

## 2021-01-21 DIAGNOSIS — F4322 Adjustment disorder with anxiety: Secondary | ICD-10-CM | POA: Diagnosis not present

## 2021-02-05 DIAGNOSIS — F4322 Adjustment disorder with anxiety: Secondary | ICD-10-CM | POA: Diagnosis not present

## 2021-02-20 DIAGNOSIS — F4322 Adjustment disorder with anxiety: Secondary | ICD-10-CM | POA: Diagnosis not present

## 2021-03-12 DIAGNOSIS — F840 Autistic disorder: Secondary | ICD-10-CM | POA: Diagnosis not present

## 2021-03-12 DIAGNOSIS — F3181 Bipolar II disorder: Secondary | ICD-10-CM | POA: Diagnosis not present

## 2021-03-12 DIAGNOSIS — F902 Attention-deficit hyperactivity disorder, combined type: Secondary | ICD-10-CM | POA: Diagnosis not present

## 2021-03-18 DIAGNOSIS — F419 Anxiety disorder, unspecified: Secondary | ICD-10-CM | POA: Diagnosis not present

## 2021-03-18 DIAGNOSIS — F330 Major depressive disorder, recurrent, mild: Secondary | ICD-10-CM | POA: Diagnosis not present

## 2021-04-03 ENCOUNTER — Other Ambulatory Visit: Payer: Self-pay

## 2021-04-03 ENCOUNTER — Ambulatory Visit (INDEPENDENT_AMBULATORY_CARE_PROVIDER_SITE_OTHER): Payer: BC Managed Care – PPO | Admitting: Endocrinology

## 2021-04-03 VITALS — BP 100/70 | HR 96 | Ht 64.0 in | Wt 160.8 lb

## 2021-04-03 DIAGNOSIS — R61 Generalized hyperhidrosis: Secondary | ICD-10-CM

## 2021-04-03 DIAGNOSIS — F4322 Adjustment disorder with anxiety: Secondary | ICD-10-CM | POA: Diagnosis not present

## 2021-04-03 DIAGNOSIS — E039 Hypothyroidism, unspecified: Secondary | ICD-10-CM | POA: Diagnosis not present

## 2021-04-03 LAB — CBC WITH DIFFERENTIAL/PLATELET
Basophils Absolute: 0 10*3/uL (ref 0.0–0.1)
Basophils Relative: 0.5 % (ref 0.0–3.0)
Eosinophils Absolute: 0.1 10*3/uL (ref 0.0–0.7)
Eosinophils Relative: 1 % (ref 0.0–5.0)
HCT: 40.6 % (ref 36.0–46.0)
Hemoglobin: 13.2 g/dL (ref 12.0–15.0)
Lymphocytes Relative: 24.1 % (ref 12.0–46.0)
Lymphs Abs: 1.6 10*3/uL (ref 0.7–4.0)
MCHC: 32.5 g/dL (ref 30.0–36.0)
MCV: 86.2 fl (ref 78.0–100.0)
Monocytes Absolute: 0.5 10*3/uL (ref 0.1–1.0)
Monocytes Relative: 7.2 % (ref 3.0–12.0)
Neutro Abs: 4.6 10*3/uL (ref 1.4–7.7)
Neutrophils Relative %: 67.2 % (ref 43.0–77.0)
Platelets: 264 10*3/uL (ref 150.0–400.0)
RBC: 4.71 Mil/uL (ref 3.87–5.11)
RDW: 13.2 % (ref 11.5–15.5)
WBC: 6.8 10*3/uL (ref 4.0–10.5)

## 2021-04-03 LAB — TSH: TSH: 0.78 u[IU]/mL (ref 0.35–5.50)

## 2021-04-03 LAB — T4, FREE: Free T4: 1.08 ng/dL (ref 0.60–1.60)

## 2021-04-03 NOTE — Progress Notes (Signed)
Subjective:    Patient ID: Laura Baxter, female    DOB: [birth year], 31 y.o.   MRN: [id number]  HPI Pt returns for f/u of hypothyroidism (dx'ed 2015; she cycled twice between hyper- and hypothyroidism until 2018, when she developed her current hypothyroidism; she takes synthroid; US showed thyroiditis; she had a normal ACTH stim test in 2019; husb has had vasectomy).  Last week, she reported heat intol, sweating, increased appetite, insomnia, and acral paresthesias.  Sxs are now resolved.   Past Medical History:  Diagnosis Date   History of febrile seizure    INFANT   History of hyperthyroidism    DX 2015--  TSH LEVELS NORMALIZED WITHOUT ANY TREATMENT   History of hypotension    History of ovarian cyst    Hypothyroidism, secondary ENDOCRINOLOGIST-  DR Everardo AllELLISON   SECONDARY TO LYMPHOCYTIC THYROIDITIS   Iron deficiency anemia    Pilonidal cyst with abscess    Wears glasses     Past Surgical History:  Procedure Laterality Date   PILONIDAL CYST EXCISION N/A 06/17/2017   Procedure: CYST EXCISION PILONIDAL;  Surgeon: Kinsinger, De BlanchLuke Aaron, MD;  Location: [facility];  Service: General;  Laterality: N/A;   SHOULDER SURGERY Left 2012   capsular repair    Social History   Socioeconomic History   Marital status: Married    Spouse name: Not on file   Number of children: Not on file   Years of education: Not on file   Highest education level: Not on file  Occupational History   Not on file  Tobacco Use   Smoking status: Never   Smokeless tobacco: Never  Vaping Use   Vaping Use: Never used  Substance and Sexual Activity   Alcohol use: No   Drug use: No   Sexual activity: Yes    Comment: IUD placed 02/ 2019 previously  Other Topics Concern   Not on file  Social History Narrative   Not on file   Social Determinants of Health   Financial Resource Strain: Not on file  Food Insecurity: Not on file  Transportation Needs: Not on file  Physical Activity:  Not on file  Stress: Not on file  Social Connections: Not on file  Intimate Partner Violence: Not on file    Current Outpatient Medications on File Prior to Visit  Medication Sig Dispense Refill   lamoTRIgine (LAMICTAL) 25 MG tablet Take by mouth.     levothyroxine (SYNTHROID) 100 MCG tablet Take 1 tablet (100 mcg total) by mouth daily before breakfast. 90 tablet 3   FLUoxetine (PROZAC) 20 MG capsule Take 1 capsule (20 mg total) by mouth daily. 90 capsule 1   No current facility-administered medications on file prior to visit.    Allergies  Allergen Reactions   Iodine Rash   Shellfish Allergy Rash   Latex Itching    Power in the gloves cause redness    Family History  Problem Relation Age of Onset   Depression Mother    Anemia Mother    Heart attack Father    Hypertension Father    Hyperlipidemia Father    Heart disease Father    Depression Father    Glaucoma Father    Graves' disease Father    Thyroid disease Father    Osteoporosis Father    Cancer Maternal Grandmother        pancreatic   Breast cancer Paternal Grandmother    Leukemia Paternal Grandmother    Leukemia Paternal Grandfather  BP 100/70    Pulse 96    Ht 5\' 4"  (1.626 m)    Wt 160 lb 12.8 oz (72.9 kg)    SpO2 98%    BMI 27.60 kg/m    Review of Systems Denies fever.      Objective:   Physical Exam VITAL SIGNS:  See vs page GENERAL: no distress NECK: thyroid is slightly and diffusely enlarged.   Lab Results  Component Value Date   TSH 0.78 04/03/2021       Assessment & Plan:  Hypothyroidism: well-controlled.  Please continue the same synthroid. Sweating and other sxs: check cbc

## 2021-04-03 NOTE — Patient Instructions (Addendum)
Blood tests are requested for you today.  We'll let you know about the results.  Let's recheck the ultrasound.  you will receive a phone call, about a day and time for an appointment. Please come back for a follow-up appointment in 1 year.    

## 2021-04-12 ENCOUNTER — Ambulatory Visit
Admission: RE | Admit: 2021-04-12 | Discharge: 2021-04-12 | Disposition: A | Discharge status: Home / Self Care | Payer: BC Managed Care – PPO | Source: Ambulatory Visit | Attending: Endocrinology | Admitting: Endocrinology

## 2021-04-12 DIAGNOSIS — E039 Hypothyroidism, unspecified: Secondary | ICD-10-CM | POA: Diagnosis not present

## 2021-04-19 DIAGNOSIS — F3181 Bipolar II disorder: Secondary | ICD-10-CM | POA: Diagnosis not present

## 2021-04-19 DIAGNOSIS — F840 Autistic disorder: Secondary | ICD-10-CM | POA: Diagnosis not present

## 2021-04-19 DIAGNOSIS — F909 Attention-deficit hyperactivity disorder, unspecified type: Secondary | ICD-10-CM | POA: Diagnosis not present

## 2021-04-23 DIAGNOSIS — F909 Attention-deficit hyperactivity disorder, unspecified type: Secondary | ICD-10-CM | POA: Diagnosis not present

## 2021-04-23 DIAGNOSIS — F419 Anxiety disorder, unspecified: Secondary | ICD-10-CM | POA: Diagnosis not present

## 2021-04-23 DIAGNOSIS — F330 Major depressive disorder, recurrent, mild: Secondary | ICD-10-CM | POA: Diagnosis not present

## 2021-05-07 DIAGNOSIS — F4322 Adjustment disorder with anxiety: Secondary | ICD-10-CM | POA: Diagnosis not present

## 2021-05-17 ENCOUNTER — Ambulatory Visit: Payer: BC Managed Care – PPO | Admitting: Endocrinology

## 2021-05-23 DIAGNOSIS — F419 Anxiety disorder, unspecified: Secondary | ICD-10-CM | POA: Diagnosis not present

## 2021-05-23 DIAGNOSIS — F909 Attention-deficit hyperactivity disorder, unspecified type: Secondary | ICD-10-CM | POA: Diagnosis not present

## 2021-05-23 DIAGNOSIS — F330 Major depressive disorder, recurrent, mild: Secondary | ICD-10-CM | POA: Diagnosis not present

## 2021-06-10 ENCOUNTER — Other Ambulatory Visit: Payer: Self-pay | Admitting: Endocrinology

## 2021-06-10 DIAGNOSIS — E039 Hypothyroidism, unspecified: Secondary | ICD-10-CM

## 2021-06-12 DIAGNOSIS — F4322 Adjustment disorder with anxiety: Secondary | ICD-10-CM | POA: Diagnosis not present

## 2021-07-02 DIAGNOSIS — F4322 Adjustment disorder with anxiety: Secondary | ICD-10-CM | POA: Diagnosis not present

## 2021-07-16 DIAGNOSIS — F909 Attention-deficit hyperactivity disorder, unspecified type: Secondary | ICD-10-CM | POA: Diagnosis not present

## 2021-07-16 DIAGNOSIS — F330 Major depressive disorder, recurrent, mild: Secondary | ICD-10-CM | POA: Diagnosis not present

## 2021-07-16 DIAGNOSIS — F419 Anxiety disorder, unspecified: Secondary | ICD-10-CM | POA: Diagnosis not present

## 2021-08-28 DIAGNOSIS — F4322 Adjustment disorder with anxiety: Secondary | ICD-10-CM | POA: Diagnosis not present

## 2021-09-10 DIAGNOSIS — F4322 Adjustment disorder with anxiety: Secondary | ICD-10-CM | POA: Diagnosis not present

## 2021-09-13 DIAGNOSIS — F330 Major depressive disorder, recurrent, mild: Secondary | ICD-10-CM | POA: Diagnosis not present

## 2021-09-13 DIAGNOSIS — F909 Attention-deficit hyperactivity disorder, unspecified type: Secondary | ICD-10-CM | POA: Diagnosis not present

## 2021-09-13 DIAGNOSIS — F419 Anxiety disorder, unspecified: Secondary | ICD-10-CM | POA: Diagnosis not present

## 2021-09-18 DIAGNOSIS — F4322 Adjustment disorder with anxiety: Secondary | ICD-10-CM | POA: Diagnosis not present

## 2021-09-23 DIAGNOSIS — F4322 Adjustment disorder with anxiety: Secondary | ICD-10-CM | POA: Diagnosis not present

## 2021-10-08 DIAGNOSIS — F4322 Adjustment disorder with anxiety: Secondary | ICD-10-CM | POA: Diagnosis not present

## 2021-10-14 DIAGNOSIS — F4322 Adjustment disorder with anxiety: Secondary | ICD-10-CM | POA: Diagnosis not present

## 2021-10-16 DIAGNOSIS — F4322 Adjustment disorder with anxiety: Secondary | ICD-10-CM | POA: Diagnosis not present

## 2021-10-24 ENCOUNTER — Telehealth: Payer: Self-pay | Admitting: Internal Medicine

## 2021-10-24 ENCOUNTER — Other Ambulatory Visit: Payer: Self-pay

## 2021-10-24 DIAGNOSIS — E039 Hypothyroidism, unspecified: Secondary | ICD-10-CM

## 2021-10-24 MED ORDER — LEVOTHYROXINE SODIUM 100 MCG PO TABS: 100.0000 ug | ORAL_TABLET | Freq: Every day | ORAL | 0 refills | Status: DC

## 2021-10-24 NOTE — Telephone Encounter (Signed)
MEDICATION: Levothyroxine  PHARMACY:  [facility]  HAS THE PATIENT CONTACTED THEIR PHARMACY?  No  IS THIS A 90 DAY SUPPLY : unknown  IS PATIENT OUT OF MEDICATION: yes  IF NOT; HOW MUCH IS LEFT:   LAST APPOINTMENT DATE: @8 /31/2023  NEXT APPOINTMENT DATE:@11 /11/2021  DO WE HAVE YOUR PERMISSION TO LEAVE A DETAILED MESSAGE?:  OTHER COMMENTS:    **Let patient know to contact pharmacy at the end of the day to make sure medication is ready. **  ** Please notify patient to allow 48-72 hours to process**  **Encourage patient to contact the pharmacy for refills or they can request refills through [facility]**

## 2021-10-24 NOTE — Telephone Encounter (Signed)
RX now sent to preferred pharmacy.  

## 2021-10-24 NOTE — Telephone Encounter (Signed)
MEDICATION: Levothyroxine 100 mg  PHARMACY: [facility]    HAS THE PATIENT CONTACTED THEIR PHARMACY?  No  IS THIS A 90 DAY SUPPLY :unknown   IS PATIENT OUT OF MEDICATION:   IF NOT; HOW MUCH IS LEFT:   LAST APPOINTMENT DATE: 04/03/2021  NEXT APPOINTMENT DATE:@11 /11/2021  DO WE HAVE YOUR PERMISSION TO LEAVE A DETAILED MESSAGE?:  OTHER COMMENTS:    **Let patient know to contact pharmacy at the end of the day to make sure medication is ready. **  ** Please notify patient to allow 48-72 hours to process**  **Encourage patient to contact the pharmacy for refills or they can request refills through [facility]**

## 2021-10-29 DIAGNOSIS — F4322 Adjustment disorder with anxiety: Secondary | ICD-10-CM | POA: Diagnosis not present

## 2021-11-05 DIAGNOSIS — F4322 Adjustment disorder with anxiety: Secondary | ICD-10-CM | POA: Diagnosis not present

## 2021-11-06 DIAGNOSIS — F4322 Adjustment disorder with anxiety: Secondary | ICD-10-CM | POA: Diagnosis not present

## 2021-11-12 DIAGNOSIS — F4322 Adjustment disorder with anxiety: Secondary | ICD-10-CM | POA: Diagnosis not present

## 2021-11-19 DIAGNOSIS — F4322 Adjustment disorder with anxiety: Secondary | ICD-10-CM | POA: Diagnosis not present

## 2021-11-27 DIAGNOSIS — F330 Major depressive disorder, recurrent, mild: Secondary | ICD-10-CM | POA: Diagnosis not present

## 2021-11-27 DIAGNOSIS — F4322 Adjustment disorder with anxiety: Secondary | ICD-10-CM | POA: Diagnosis not present

## 2021-11-27 DIAGNOSIS — Z79899 Other long term (current) drug therapy: Secondary | ICD-10-CM | POA: Diagnosis not present

## 2021-11-27 DIAGNOSIS — F419 Anxiety disorder, unspecified: Secondary | ICD-10-CM | POA: Diagnosis not present

## 2021-11-27 DIAGNOSIS — F909 Attention-deficit hyperactivity disorder, unspecified type: Secondary | ICD-10-CM | POA: Diagnosis not present

## 2021-12-24 DIAGNOSIS — F4322 Adjustment disorder with anxiety: Secondary | ICD-10-CM | POA: Diagnosis not present

## 2022-01-03 ENCOUNTER — Encounter: Payer: Self-pay | Admitting: Internal Medicine

## 2022-01-03 ENCOUNTER — Ambulatory Visit: Payer: BC Managed Care – PPO | Admitting: Internal Medicine

## 2022-01-03 VITALS — BP 120/74 | HR 71 | Ht 64.0 in | Wt 153.8 lb

## 2022-01-03 DIAGNOSIS — E039 Hypothyroidism, unspecified: Secondary | ICD-10-CM | POA: Diagnosis not present

## 2022-01-03 NOTE — Progress Notes (Signed)
Patient ID: Laura Baxter, female   DOB: 22-Jun-1990, 31 y.o.   MRN: [id number]  HPI  Laura Baxter is a 31 y.o.-year-old female, returning for follow-up for hypothyroidism.  She previously saw Dr. Everardo All, last visit 9 months ago.  Pt. has been dx with hyperthyroidism in 2015.  She cycled twice between hyperand hypothyroidism but currently spontaneously hypothyroid >> on Levothyroxine.  Pt is on levothyroxine 100 mcg daily, taken: - in am - fasting - tea  - at least 30 min from b'fast - no calcium - no iron (was on this in the past) - + multivitamins along with it  - started 3 mo ago - no PPIs - not on Biotin - on Probiotic  I reviewed pt's thyroid tests: Lab Results  Component Value Date   TSH 0.78 04/03/2021   TSH 0.51 05/02/2020   TSH 1.21 01/16/2020   TSH 0.36 10/21/2019   TSH 0.618 05/13/2019   TSH 0.34 (L) 02/04/2019   TSH 0.85 10/15/2018   TSH 1.06 08/02/2018   TSH 0.31 (L) 04/09/2018   TSH 2.23 01/11/2018   FREET4 1.08 04/03/2021   FREET4 1.21 01/16/2020   FREET4 1.25 10/21/2019   FREET4 1.20 05/13/2019   FREET4 1.25 02/04/2019   FREET4 1.34 10/15/2018   FREET4 1.05 08/02/2018   FREET4 1.52 04/09/2018   FREET4 1.06 01/11/2018   FREET4 1.26 11/10/2017   Antithyroid antibodies: No results found for: "THGAB" No components found for: "TPOAB"  Thyroid U/S (04/12/2021): Mildly heterogeneous thyroid gland without nodules, smaller than before  Patient describes occasional pressure in her neck, but no hoarseness, dysphagia/odynophagia.  She has + FH of thyroid disorders in: Father. No FH of thyroid cancer.  No h/o radiation tx to head or neck. No recent use of iodine supplements.  Pt. also has a history of iron deficiency anemia, hypotension (normal ACTH stimulation test in 2019). She has 2 children.  No further pregnancy plans.  Husband had a vasectomy.  ROS: + see HPI  Past Medical History:  Diagnosis Date   History of febrile seizure     INFANT   History of hyperthyroidism    DX 2015--  TSH LEVELS NORMALIZED WITHOUT ANY TREATMENT   History of hypotension    History of ovarian cyst    Hypothyroidism, secondary ENDOCRINOLOGIST-  DR Everardo All   SECONDARY TO LYMPHOCYTIC THYROIDITIS   Iron deficiency anemia    Pilonidal cyst with abscess    Wears glasses    Past Surgical History:  Procedure Laterality Date   PILONIDAL CYST EXCISION N/A 06/17/2017   Procedure: CYST EXCISION PILONIDAL;  Surgeon: Kinsinger, De Blanch, MD;  Location: [facility];  Service: General;  Laterality: N/A;   SHOULDER SURGERY Left 2012   capsular repair   Social History   Socioeconomic History   Marital status: Married    Spouse name: Not on file   Number of children: Not on file   Years of education: Not on file   Highest education level: Not on file  Occupational History   Not on file  Tobacco Use   Smoking status: Never   Smokeless tobacco: Never  Vaping Use   Vaping Use: Never used  Substance and Sexual Activity   Alcohol use: No   Drug use: No   Sexual activity: Yes    Comment: IUD placed 02/ 2019 previously  Other Topics Concern   Not on file  Social History Narrative   Not on file   Social  Determinants of Health   Financial Resource Strain: Not on file  Food Insecurity: Not on file  Transportation Needs: Not on file  Physical Activity: Not on file  Stress: Not on file  Social Connections: Not on file  Intimate Partner Violence: Not on file   Current Outpatient Medications on File Prior to Visit  Medication Sig Dispense Refill   FLUoxetine (PROZAC) 20 MG capsule Take 1 capsule (20 mg total) by mouth daily. 90 capsule 1   lamoTRIgine (LAMICTAL) 25 MG tablet Take by mouth.     levothyroxine (SYNTHROID) 100 MCG tablet Take 1 tablet (100 mcg total) by mouth daily before breakfast. 90 tablet 0   No current facility-administered medications on file prior to visit.   Allergies  Allergen Reactions   Iodine  Rash   Shellfish Allergy Rash   Latex Itching    Power in the gloves cause redness   Family History  Problem Relation Age of Onset   Depression Mother    Anemia Mother    Heart attack Father    Hypertension Father    Hyperlipidemia Father    Heart disease Father    Depression Father    Glaucoma Father    Graves' disease Father    Thyroid disease Father    Osteoporosis Father    Cancer Maternal Grandmother        pancreatic   Breast cancer Paternal Grandmother    Leukemia Paternal Grandmother    Leukemia Paternal Grandfather    PE: BP 120/74 (BP Location: Right Arm, Patient Position: Sitting, Cuff Size: Normal)   Pulse 71   Ht 5\' 4"  (1.626 m)   Wt 153 lb 12.8 oz (69.8 kg)   SpO2 99%   BMI 26.40 kg/m  Wt Readings from Last 3 Encounters:  01/03/22 153 lb 12.8 oz (69.8 kg)  04/03/21 160 lb 12.8 oz (72.9 kg)  05/17/20 155 lb (70.3 kg)   Constitutional: overweight, in NAD Eyes:  EOMI, no exophthalmos ENT: no neck masses, no cervical lymphadenopathy Cardiovascular: RRR, No MRG Respiratory: CTA B Musculoskeletal: no deformities Skin:no rashes Neurological: no tremor with outstretched hands  ASSESSMENT: 1.  Acquired hypothyroidism  2.  Neck pressure  PLAN:  1. Patient with long-standing hypothyroidism, on levothyroxine therapy.  She had alternating hypo and hyperthyroidism initially, which can be consistent with Hashimoto's thyroiditis. - most likely related to thyroiditis, possibly autoimmune -reviewed thyroid ultrasound report from 03/2021 that showed heterogeneity consistent with thyroiditis - latest thyroid labs reviewed with pt. >> normal: Lab Results  Component Value Date   TSH 0.78 04/03/2021  - she continues on LT4 100 mcg daily - pt feels good on this dose. - we discussed about taking the thyroid hormone every day, with water, >30 minutes before breakfast, separated by >4 hours from acid reflux medications, calcium, iron, multivitamins. Pt. Is not  taking  it correctly.  Approximately 3 months ago, she started multivitamins along with levothyroxine.  I advised her to move these more than 4 hours later. - will check thyroid tests in 5 weeks after the above change: TSH and fT4 - I will see her back in a year  2.  Neck pressure -We discussed that this may be related to thyroiditis.  She may have episodes of increased neck pressure when there is more inflammation (she may try anti-inflammatory medicines in that case) -She wonders about the timeline to repeat a new ultrasound.  She previously was getting them every 2 years.  We discussed that in the  absence of a nodule and with a shrinking thyroid gland, we do not need further ultrasounds  Needs refills after the next labs.  Philemon Kingdom, MD PhD [facility]

## 2022-01-03 NOTE — Patient Instructions (Addendum)
Please continue Levothyroxine 100 mcg daily.  Take the thyroid hormone every day, with water, at least 30 minutes before breakfast, separated by at least 4 hours from: - acid reflux medications - calcium - iron - multivitamins  Move the Multivitamins >4h later.  Please come back for labs in ~ 5 weeks.  Please return in 1 year.

## 2022-01-08 DIAGNOSIS — F4322 Adjustment disorder with anxiety: Secondary | ICD-10-CM | POA: Diagnosis not present

## 2022-01-13 DIAGNOSIS — F4322 Adjustment disorder with anxiety: Secondary | ICD-10-CM | POA: Diagnosis not present

## 2022-01-14 DIAGNOSIS — M79644 Pain in right finger(s): Secondary | ICD-10-CM | POA: Diagnosis not present

## 2022-01-14 DIAGNOSIS — M25531 Pain in right wrist: Secondary | ICD-10-CM | POA: Diagnosis not present

## 2022-02-07 ENCOUNTER — Other Ambulatory Visit: Payer: BC Managed Care – PPO

## 2022-02-10 ENCOUNTER — Other Ambulatory Visit (INDEPENDENT_AMBULATORY_CARE_PROVIDER_SITE_OTHER): Payer: BC Managed Care – PPO

## 2022-02-10 DIAGNOSIS — E039 Hypothyroidism, unspecified: Secondary | ICD-10-CM

## 2022-02-10 LAB — TSH: TSH: 1.31 u[IU]/mL (ref 0.35–5.50)

## 2022-02-10 LAB — T4, FREE: Free T4: 0.92 ng/dL (ref 0.60–1.60)

## 2022-02-26 DIAGNOSIS — F4322 Adjustment disorder with anxiety: Secondary | ICD-10-CM | POA: Diagnosis not present

## 2022-02-26 DIAGNOSIS — F419 Anxiety disorder, unspecified: Secondary | ICD-10-CM | POA: Diagnosis not present

## 2022-02-26 DIAGNOSIS — F330 Major depressive disorder, recurrent, mild: Secondary | ICD-10-CM | POA: Diagnosis not present

## 2022-02-26 DIAGNOSIS — F909 Attention-deficit hyperactivity disorder, unspecified type: Secondary | ICD-10-CM | POA: Diagnosis not present

## 2022-03-03 DIAGNOSIS — F4322 Adjustment disorder with anxiety: Secondary | ICD-10-CM | POA: Diagnosis not present

## 2022-03-05 DIAGNOSIS — F4322 Adjustment disorder with anxiety: Secondary | ICD-10-CM | POA: Diagnosis not present

## 2022-03-10 ENCOUNTER — Other Ambulatory Visit: Payer: Self-pay

## 2022-03-10 DIAGNOSIS — E039 Hypothyroidism, unspecified: Secondary | ICD-10-CM

## 2022-03-10 MED ORDER — LEVOTHYROXINE SODIUM 100 MCG PO TABS: 100.0000 ug | ORAL_TABLET | Freq: Every day | ORAL | 1 refills | Status: DC

## 2022-03-12 DIAGNOSIS — F4322 Adjustment disorder with anxiety: Secondary | ICD-10-CM | POA: Diagnosis not present

## 2022-03-17 DIAGNOSIS — F4322 Adjustment disorder with anxiety: Secondary | ICD-10-CM | POA: Diagnosis not present

## 2022-03-19 DIAGNOSIS — F4322 Adjustment disorder with anxiety: Secondary | ICD-10-CM | POA: Diagnosis not present

## 2022-03-24 DIAGNOSIS — F4322 Adjustment disorder with anxiety: Secondary | ICD-10-CM | POA: Diagnosis not present

## 2022-03-26 DIAGNOSIS — F4322 Adjustment disorder with anxiety: Secondary | ICD-10-CM | POA: Diagnosis not present

## 2022-04-16 ENCOUNTER — Ambulatory Visit
Admission: EM | Admit: 2022-04-16 | Discharge: 2022-04-16 | Disposition: A | Discharge status: Home / Self Care | Payer: BC Managed Care – PPO | Attending: Family Medicine | Admitting: Family Medicine

## 2022-04-16 ENCOUNTER — Ambulatory Visit (INDEPENDENT_AMBULATORY_CARE_PROVIDER_SITE_OTHER): Payer: BC Managed Care – PPO

## 2022-04-16 DIAGNOSIS — R059 Cough, unspecified: Secondary | ICD-10-CM | POA: Diagnosis not present

## 2022-04-16 DIAGNOSIS — R051 Acute cough: Secondary | ICD-10-CM

## 2022-04-16 DIAGNOSIS — R062 Wheezing: Secondary | ICD-10-CM | POA: Diagnosis not present

## 2022-04-16 DIAGNOSIS — R5383 Other fatigue: Secondary | ICD-10-CM | POA: Diagnosis not present

## 2022-04-16 DIAGNOSIS — R0602 Shortness of breath: Secondary | ICD-10-CM

## 2022-04-16 MED ORDER — PREDNISONE 20 MG PO TABS: 40.0000 mg | ORAL_TABLET | Freq: Every day | ORAL | 0 refills | Status: AC

## 2022-04-16 MED ORDER — DOXYCYCLINE HYCLATE 100 MG PO CAPS: 100.0000 mg | ORAL_CAPSULE | Freq: Two times a day (BID) | ORAL | 0 refills | Status: AC

## 2022-04-16 NOTE — ED Triage Notes (Signed)
Pt presents with intermittent sore throat, cough, fatigue, generalized body aches, bilateral ear pain, congestion, and shortness of breath X 2 weeks.

## 2022-04-16 NOTE — ED Provider Notes (Signed)
[place]   [id number] 04/16/22 Arrival Time: 1009  ASSESSMENT & PLAN:  1. Acute cough   2. Other fatigue   3. Wheezing    I have personally viewed the imaging studies ordered this visit. Ques subtle haziness of RLL. Otherwise no acute changes.  OTC symptom care as needed.  Given duration of symptoms will treat empirically for PNA/wheezing with: Discharge Medication List as of 04/16/2022 12:23 PM     START taking these medications   Details  doxycycline (VIBRAMYCIN) 100 MG capsule Take 1 capsule (100 mg total) by mouth 2 (two) times daily., Starting Wed 04/16/2022, Normal    predniSONE (DELTASONE) 20 MG tablet Take 2 tablets (40 mg total) by mouth daily., Starting Wed 04/16/2022, Normal         Follow-up Information     Leeroy Cha, MD.   Specialty: Internal Medicine Why: If worsening or failing to improve as anticipated. Contact information: [address] [place] [address] [phone]                 Reviewed expectations re: course of current medical issues. Questions answered. Outlined signs and symptoms indicating need for more acute intervention. Understanding verbalized. After Visit Summary given.   SUBJECTIVE: History from: Patient. Laura Baxter is a 32 y.o. female. Pt presents with intermittent sore throat, cough, fatigue, generalized body aches, bilateral ear pain, congestion, and shortness of breath X 2 weeks. No h/o asthma. Non-smoker. Denies: fever. Normal PO intake without n/v/d.  OBJECTIVE:  Vitals:   04/16/22 1128  BP: 107/75  Pulse: 75  Resp: 17  Temp: 97.9 F (36.6 C)  TempSrc: Oral  SpO2: 98%    General appearance: alert; no distress Eyes: PERRLA; EOMI; conjunctiva normal HENT: [place]; AT; with mild nasal congestion Neck: supple  Lungs: speaks full sentences without difficulty; unlabored; slight wheezing over lower lung fields that clears with deep breaths Extremities: no edema Skin:  warm and dry Neurologic: normal gait Psychological: alert and cooperative; normal mood and affect  Imaging: DG Chest 2 View  Result Date: 04/16/2022 CLINICAL DATA:  Shortness of breath.  Cough for 2 weeks. EXAM: CHEST - 2 VIEW COMPARISON:  Chest two views 03/13/2013 FINDINGS: Cardiac silhouette and mediastinal contours are within normal limits. Mild bilateral interstitial thickening is mildly increased from prior. Part of the apparent change may be due to differences in technique. No focal airspace opacity to indicate pneumonia. No pleural effusion or pneumothorax. No significant thoracic degenerative disc disease. IMPRESSION: Mild bilateral interstitial thickening appears mildly increased from prior, however there is no cardiomegaly to suggest possible congestive heart failure and pulmonary edema, and this may be just due to differences in technique. No focal airspace opacity to indicate pneumonia. Electronically Signed   By: Yvonne Kendall M.D.   On: 04/16/2022 12:16    Allergies  Allergen Reactions   Iodine Rash   Shellfish Allergy Rash   Latex Itching    Power in the gloves cause redness    Past Medical History:  Diagnosis Date   History of febrile seizure    INFANT   History of hyperthyroidism    DX 2015--  TSH LEVELS NORMALIZED WITHOUT ANY TREATMENT   History of hypotension    History of ovarian cyst    Hypothyroidism, secondary ENDOCRINOLOGIST-  DR Loanne Drilling   SECONDARY TO LYMPHOCYTIC THYROIDITIS   Iron deficiency anemia    Pilonidal cyst with abscess    Wears glasses    Social History  Socioeconomic History   Marital status: Married    Spouse name: Not on file   Number of children: Not on file   Years of education: Not on file   Highest education level: Not on file  Occupational History   Not on file  Tobacco Use   Smoking status: Never   Smokeless tobacco: Never  Vaping Use   Vaping Use: Never used  Substance and Sexual Activity   Alcohol use: No   Drug use: No    Sexual activity: Yes    Comment: IUD placed 02/ 2019 previously  Other Topics Concern   Not on file  Social History Narrative   Not on file   Social Determinants of Health   Financial Resource Strain: Not on file  Food Insecurity: Not on file  Transportation Needs: Not on file  Physical Activity: Not on file  Stress: Not on file  Social Connections: Not on file  Intimate Partner Violence: Not on file   Family History  Problem Relation Age of Onset   Depression Mother    Anemia Mother    Heart attack Father    Hypertension Father    Hyperlipidemia Father    Heart disease Father    Depression Father    Glaucoma Father    Berenice Primas' disease Father    Thyroid disease Father    Osteoporosis Father    Cancer Maternal Grandmother        pancreatic   Breast cancer Paternal Grandmother    Leukemia Paternal Grandmother    Leukemia Paternal Grandfather    Past Surgical History:  Procedure Laterality Date   PILONIDAL CYST EXCISION N/A 06/17/2017   Procedure: CYST EXCISION PILONIDAL;  Surgeon: Kinsinger, Arta Bruce, MD;  Location: [place];  Service: General;  Laterality: N/A;   SHOULDER SURGERY Left 2012   capsular repair     Vanessa Kick, MD 04/16/22 1259

## 2022-05-05 DIAGNOSIS — L218 Other seborrheic dermatitis: Secondary | ICD-10-CM | POA: Diagnosis not present

## 2022-05-05 DIAGNOSIS — F4322 Adjustment disorder with anxiety: Secondary | ICD-10-CM | POA: Diagnosis not present

## 2022-05-05 DIAGNOSIS — L814 Other melanin hyperpigmentation: Secondary | ICD-10-CM | POA: Diagnosis not present

## 2022-05-05 DIAGNOSIS — D2222 Melanocytic nevi of left ear and external auricular canal: Secondary | ICD-10-CM | POA: Diagnosis not present

## 2022-05-05 DIAGNOSIS — D225 Melanocytic nevi of trunk: Secondary | ICD-10-CM | POA: Diagnosis not present

## 2022-05-14 DIAGNOSIS — F411 Generalized anxiety disorder: Secondary | ICD-10-CM | POA: Diagnosis not present

## 2022-05-21 DIAGNOSIS — F411 Generalized anxiety disorder: Secondary | ICD-10-CM | POA: Diagnosis not present

## 2022-05-26 DIAGNOSIS — F411 Generalized anxiety disorder: Secondary | ICD-10-CM | POA: Diagnosis not present

## 2022-05-28 DIAGNOSIS — F909 Attention-deficit hyperactivity disorder, unspecified type: Secondary | ICD-10-CM | POA: Diagnosis not present

## 2022-05-28 DIAGNOSIS — F419 Anxiety disorder, unspecified: Secondary | ICD-10-CM | POA: Diagnosis not present

## 2022-05-28 DIAGNOSIS — F330 Major depressive disorder, recurrent, mild: Secondary | ICD-10-CM | POA: Diagnosis not present

## 2022-06-16 DIAGNOSIS — F411 Generalized anxiety disorder: Secondary | ICD-10-CM | POA: Diagnosis not present

## 2022-06-23 DIAGNOSIS — F411 Generalized anxiety disorder: Secondary | ICD-10-CM | POA: Diagnosis not present

## 2022-06-27 DIAGNOSIS — F909 Attention-deficit hyperactivity disorder, unspecified type: Secondary | ICD-10-CM | POA: Diagnosis not present

## 2022-06-27 DIAGNOSIS — F330 Major depressive disorder, recurrent, mild: Secondary | ICD-10-CM | POA: Diagnosis not present

## 2022-06-27 DIAGNOSIS — F419 Anxiety disorder, unspecified: Secondary | ICD-10-CM | POA: Diagnosis not present

## 2022-07-14 DIAGNOSIS — F411 Generalized anxiety disorder: Secondary | ICD-10-CM | POA: Diagnosis not present

## 2022-07-28 DIAGNOSIS — F419 Anxiety disorder, unspecified: Secondary | ICD-10-CM | POA: Diagnosis not present

## 2022-07-28 DIAGNOSIS — F330 Major depressive disorder, recurrent, mild: Secondary | ICD-10-CM | POA: Diagnosis not present

## 2022-07-28 DIAGNOSIS — F909 Attention-deficit hyperactivity disorder, unspecified type: Secondary | ICD-10-CM | POA: Diagnosis not present

## 2022-07-28 DIAGNOSIS — F411 Generalized anxiety disorder: Secondary | ICD-10-CM | POA: Diagnosis not present

## 2022-08-25 DIAGNOSIS — F411 Generalized anxiety disorder: Secondary | ICD-10-CM | POA: Diagnosis not present

## 2022-09-15 DIAGNOSIS — F411 Generalized anxiety disorder: Secondary | ICD-10-CM | POA: Diagnosis not present

## 2022-09-24 DIAGNOSIS — Z01411 Encounter for gynecological examination (general) (routine) with abnormal findings: Secondary | ICD-10-CM | POA: Diagnosis not present

## 2022-09-24 DIAGNOSIS — Z124 Encounter for screening for malignant neoplasm of cervix: Secondary | ICD-10-CM | POA: Diagnosis not present

## 2022-09-24 DIAGNOSIS — F310 Bipolar disorder, current episode hypomanic: Secondary | ICD-10-CM | POA: Diagnosis not present

## 2022-09-24 DIAGNOSIS — N921 Excessive and frequent menstruation with irregular cycle: Secondary | ICD-10-CM | POA: Diagnosis not present

## 2022-10-16 DIAGNOSIS — Z808 Family history of malignant neoplasm of other organs or systems: Secondary | ICD-10-CM | POA: Diagnosis not present

## 2022-10-16 DIAGNOSIS — N921 Excessive and frequent menstruation with irregular cycle: Secondary | ICD-10-CM | POA: Diagnosis not present

## 2022-10-16 DIAGNOSIS — Z803 Family history of malignant neoplasm of breast: Secondary | ICD-10-CM | POA: Diagnosis not present

## 2022-11-05 DIAGNOSIS — F411 Generalized anxiety disorder: Secondary | ICD-10-CM | POA: Diagnosis not present

## 2022-11-10 DIAGNOSIS — F419 Anxiety disorder, unspecified: Secondary | ICD-10-CM | POA: Diagnosis not present

## 2022-11-10 DIAGNOSIS — F909 Attention-deficit hyperactivity disorder, unspecified type: Secondary | ICD-10-CM | POA: Diagnosis not present

## 2022-11-10 DIAGNOSIS — F330 Major depressive disorder, recurrent, mild: Secondary | ICD-10-CM | POA: Diagnosis not present

## 2022-11-26 DIAGNOSIS — F411 Generalized anxiety disorder: Secondary | ICD-10-CM | POA: Diagnosis not present

## 2022-12-04 ENCOUNTER — Emergency Department (HOSPITAL_BASED_OUTPATIENT_CLINIC_OR_DEPARTMENT_OTHER)
Admission: EM | Admit: 2022-12-04 | Discharge: 2022-12-04 | Disposition: A | Discharge status: Home / Self Care | Payer: BC Managed Care – PPO | Attending: Emergency Medicine | Admitting: Emergency Medicine

## 2022-12-04 ENCOUNTER — Encounter (HOSPITAL_BASED_OUTPATIENT_CLINIC_OR_DEPARTMENT_OTHER): Payer: Self-pay | Admitting: Emergency Medicine

## 2022-12-04 ENCOUNTER — Other Ambulatory Visit: Payer: Self-pay

## 2022-12-04 ENCOUNTER — Emergency Department (HOSPITAL_BASED_OUTPATIENT_CLINIC_OR_DEPARTMENT_OTHER): Payer: BC Managed Care – PPO

## 2022-12-04 DIAGNOSIS — S93491A Sprain of other ligament of right ankle, initial encounter: Secondary | ICD-10-CM | POA: Diagnosis not present

## 2022-12-04 DIAGNOSIS — X509XXA Other and unspecified overexertion or strenuous movements or postures, initial encounter: Secondary | ICD-10-CM | POA: Diagnosis not present

## 2022-12-04 DIAGNOSIS — S99911A Unspecified injury of right ankle, initial encounter: Secondary | ICD-10-CM | POA: Diagnosis not present

## 2022-12-04 DIAGNOSIS — S93431A Sprain of tibiofibular ligament of right ankle, initial encounter: Secondary | ICD-10-CM | POA: Diagnosis not present

## 2022-12-04 DIAGNOSIS — M25571 Pain in right ankle and joints of right foot: Secondary | ICD-10-CM | POA: Diagnosis not present

## 2022-12-04 NOTE — Discharge Instructions (Signed)
X-ray today without any evidence of broken bones but definitely have a bad sprain.  Wear the brace consistently for the next week and then if you are still having pain continue to wear it but if not you can trial without it.  No jujitsu until pain is gone.  Elevate and ice as well.  Tylenol and ibuprofen as needed for pain

## 2022-12-04 NOTE — ED Provider Notes (Signed)
[facility] Provider Note   CSN: [id number] Arrival date & time: 12/04/22  1005     History  Chief Complaint  Patient presents with   Ankle Pain    Laura Baxter is a 32 y.o. female.  Patient is a 32 year old female with no significant medical history presenting today with injury to the right ankle.  She was at jujitsu and reports her leg was behind her and then somebody fell on her and she felt some popping and crunching in her ankle.  Since that time there is been swelling and bruising.  She is able to bear weight but has pain with palpation.  No other areas of injury.  The history is provided by the patient.  Ankle Pain      Home Medications Prior to Admission medications   Medication Sig Start Date End Date Taking? Authorizing Provider  doxycycline (VIBRAMYCIN) 100 MG capsule Take 1 capsule (100 mg total) by mouth 2 (two) times daily. 04/16/22   Mardella Layman, MD  FLUoxetine (PROZAC) 20 MG capsule Take 1 capsule (20 mg total) by mouth daily. 09/08/19 03/06/20  Roma Schanz, CNM  lamoTRIgine (LAMICTAL) 25 MG tablet Take by mouth. 04/26/20   [provider]  levothyroxine (SYNTHROID) 100 MCG tablet Take 1 tablet (100 mcg total) by mouth daily before breakfast. 03/10/22   Carlus Pavlov, MD  predniSONE (DELTASONE) 20 MG tablet Take 2 tablets (40 mg total) by mouth daily. 04/16/22   Mardella Layman, MD      Allergies    Iodine, Shellfish allergy, and Latex    Review of Systems   Review of Systems  Physical Exam Updated Vital Signs BP 110/65 (BP Location: Left Arm)   Pulse 70   Temp 98.4 F (36.9 C) (Oral)   Resp 16   Ht 5\' 4"  (1.626 m)   Wt 68 kg   LMP 12/03/2022 (Exact Date)   SpO2 99%   BMI 25.75 kg/m  Physical Exam Vitals and nursing note reviewed.  Cardiovascular:     Pulses: Normal pulses.  Pulmonary:     Effort: Pulmonary effort is normal.  Musculoskeletal:        General: Tenderness present. No  deformity.     Right ankle: Ecchymosis present. Tenderness present over the lateral malleolus and ATF ligament. No base of 5th metatarsal or proximal fibula tenderness. Normal range of motion.  Neurological:     Mental Status: She is alert. Mental status is at baseline.     ED Results / Procedures / Treatments   Labs (all labs ordered are listed, but only abnormal results are displayed) Labs Reviewed - No data to display  EKG None  Radiology DG Ankle Complete Right  Result Date: 12/04/2022 CLINICAL DATA:  Right ankle pain. EXAM: RIGHT ANKLE - COMPLETE 3+ VIEW COMPARISON:  None Available. FINDINGS: Soft tissue swelling about the anterolateral aspect of the ankle with suspected small ankle joint effusion. No associated fracture or dislocation. Note is made of a small os peroneus. Joint spaces appear preserved. The ankle mortise appears preserved. No radiopaque foreign body. IMPRESSION: Soft tissue swelling about the anterolateral aspect of the ankle with suspected small ankle joint effusion without associated fracture or dislocation. Electronically Signed   By: Simonne Come M.D.   On: 12/04/2022 12:11    Procedures Procedures    Medications Ordered in ED Medications - No data to display  ED Course/ Medical Decision Making/ A&P  Medical Decision Making Amount and/or Complexity of Data Reviewed Radiology: ordered.   Patient presenting today after an injury to her ankle during jujitsu.  Patient has significant swelling and ecchymosis noted to the lateral portion of the right ankle.  Neurovascularly intact.  No Achilles tenderness.  No fibular head tenderness concerning for [place].  I have independently visualized and interpreted pt's images today.  Ankle imaging is negative for fracture.  Radiology reports some soft tissue swelling and possible small joint effusion.  At this time patient will be treated for a sprain.  She is able to ambulate with  minimal discomfort.  She was placed in a ASO and given sports medicine follow-up.         Final Clinical Impression(s) / ED Diagnoses Final diagnoses:  Sprain of anterior talofibular ligament of right ankle, initial encounter    Rx / DC Orders ED Discharge Orders     None         Gwyneth Sprout, MD 12/04/22 1414

## 2022-12-04 NOTE — ED Triage Notes (Signed)
Pt to ER with c/o right ankle pain after rolling it over this AM.  Pt reports "crunching" noise.  Reports pain is worse when she is sitting and that it feels better when she is standing on it.

## 2022-12-30 ENCOUNTER — Ambulatory Visit: Payer: BC Managed Care – PPO | Admitting: Orthopaedic Surgery

## 2022-12-30 VITALS — BP 121/77 | HR 76

## 2022-12-30 DIAGNOSIS — S93409A Sprain of unspecified ligament of unspecified ankle, initial encounter: Secondary | ICD-10-CM | POA: Insufficient documentation

## 2022-12-30 DIAGNOSIS — S93491A Sprain of other ligament of right ankle, initial encounter: Secondary | ICD-10-CM | POA: Diagnosis not present

## 2022-12-30 NOTE — Progress Notes (Signed)
Office Visit Note   Patient: Laura Baxter           Date of Birth: 05-Feb-1991           MRN: [id number] Visit Date: 12/30/2022              Requested by: Lorenda Ishihara, MD [address] [place],  [place] [address] PCP: Lorenda Ishihara, MD   Assessment & Plan: Visit Diagnoses:  1. Sprain of anterior talofibular ligament of right ankle, initial encounter     Plan: Patient's 4 weeks 4 days out from a ankle sprain.  Her ecchymosis has resolved she is getting improvement in her symptoms.  She stayed out of martial arts class and should be able to resume likely in about 3 weeks.  She will call if she would like some therapy if she does not continue to improve.  Pathophysiology discussed.  She will use her ankle brace short-term when she resumes martial arts for a while.  She can follow-up if she has ongoing symptoms.  Follow-Up Instructions: Return if symptoms worsen or fail to improve.   Orders:  No orders of the defined types were placed in this encounter.  No orders of the defined types were placed in this encounter.     Procedures: No procedures performed   Clinical Data: No additional findings.   Subjective: Chief Complaint  Patient presents with   Right Ankle - Injury    Injury   32 year old female injured her right ankle 12/04/2022 at jujitsu class.  She states someone fell on her ankle which was behind her she felt a pop and had pain she was able ambulate.  Radiographs are negative for bony injury but positive for soft tissue swelling laterally.  Patient wearing a lace up Swede-O.  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  Review of Systems all other systems are noncontributory to HPI.   Objective: Vital Signs: BP 121/77   Pulse 76   LMP 12/03/2022 (Exact Date)   Physical Exam Constitutional:      Appearance: She is well-developed.  HENT:     Head: Normocephalic.     Right Ear: External ear normal.     Left Ear: External  ear normal. There is no impacted cerumen.  Eyes:     Pupils: Pupils are equal, round, and reactive to light.  Neck:     Thyroid: No thyromegaly.     Trachea: No tracheal deviation.  Cardiovascular:     Rate and Rhythm: Normal rate.  Pulmonary:     Effort: Pulmonary effort is normal.  Abdominal:     Palpations: Abdomen is soft.  Musculoskeletal:     Cervical back: No rigidity.  Skin:    General: Skin is warm and dry.  Neurological:     Mental Status: She is alert and oriented to person, place, and time.  Psychiatric:        Behavior: Behavior normal.     Ortho Exam patient is no swelling of the ankle mild tenderness anterior talofibular ligament region no tenderness over the syndesmosis.  Trace anterior drawer with good endpoint.  No ankle effusion no tenderness over the deltoid ligament subtalar motion is normal.  Skin over the plantar and dorsal surface of the foot is normal.  Specialty Comments:  No specialty comments available.  Imaging: CLINICAL DATA:  Right ankle pain.   EXAM: RIGHT ANKLE - COMPLETE 3+ VIEW   COMPARISON:  None Available.   FINDINGS: Soft tissue  swelling about the anterolateral aspect of the ankle with suspected small ankle joint effusion. No associated fracture or dislocation. Note is made of a small os peroneus. Joint spaces appear preserved. The ankle mortise appears preserved. No radiopaque foreign body.   IMPRESSION: Soft tissue swelling about the anterolateral aspect of the ankle with suspected small ankle joint effusion without associated fracture or dislocation.     Electronically Signed   By: Simonne Come M.D.   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   Past Medical History:  Diagnosis Date   History of febrile seizure    INFANT   History of hyperthyroidism    DX 2015--  TSH LEVELS NORMALIZED WITHOUT ANY TREATMENT   History of hypotension    History of ovarian cyst    Hypothyroidism, secondary ENDOCRINOLOGIST-  DR Everardo All   SECONDARY TO LYMPHOCYTIC THYROIDITIS   Iron deficiency anemia    Pilonidal cyst with abscess    Wears glasses     Family History  Problem Relation Age of Onset   Depression Mother    Anemia Mother    Heart attack Father    Hypertension Father    Hyperlipidemia Father    Heart disease Father    Depression Father    Glaucoma Father    Graves' disease Father    Thyroid disease Father    Osteoporosis Father    Cancer Maternal Grandmother        pancreatic   Breast cancer Paternal Grandmother    Leukemia Paternal Grandmother    Leukemia Paternal Grandfather     Past Surgical History:  Procedure Laterality Date   PILONIDAL CYST EXCISION N/A 06/17/2017   Procedure: CYST EXCISION PILONIDAL;  Surgeon: Kinsinger, De Blanch, MD;  Location: [facility];  Service: General;  Laterality: N/A;   SHOULDER SURGERY Left 2012   capsular repair   Social History   Occupational History   Not on file  Tobacco Use   Smoking status: Never   Smokeless tobacco: Never  Vaping Use   Vaping status: Never Used  Substance and Sexual Activity   Alcohol use: No   Drug use: No   Sexual activity: Yes    Comment: IUD placed 02/ 2019 previously

## 2022-12-31 IMAGING — CT CT RENAL STONE PROTOCOL
2 of 4 series · 15 of 46 positions shown, 17 images · non-contrast
Comparison: None.

CLINICAL DATA: 30-year-old female with lower abdominal pain for 2
days radiating to the back.

EXAM:
CT ABDOMEN AND PELVIS WITHOUT CONTRAST
TECHNIQUE: Multidetector CT imaging of the abdomen and pelvis was performed
following the standard protocol without IV contrast.

[Series 3: ap without · axial · non-contrast · 0.68mm/px · z∈[-1046,-616]mm · 12 of 96 slices shown, 14 images]
[im 5/96  soft-tissue]
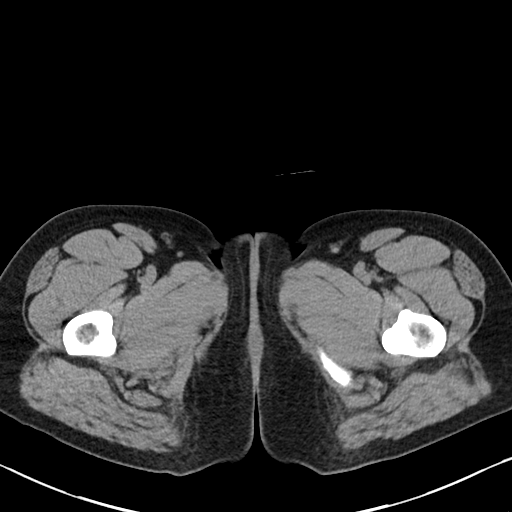
[im 5/96  bone]
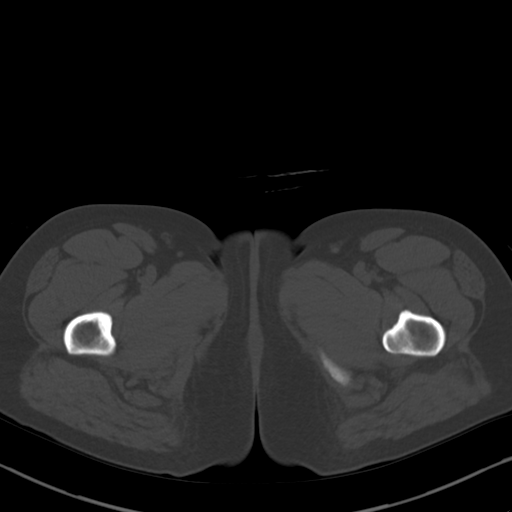
[im 14/96  soft-tissue]
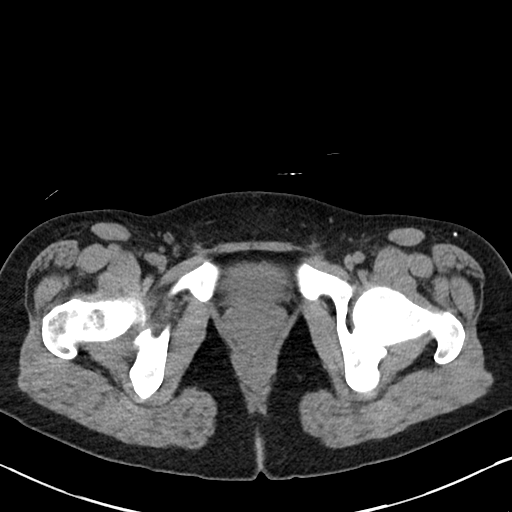
[im 23/96  soft-tissue]
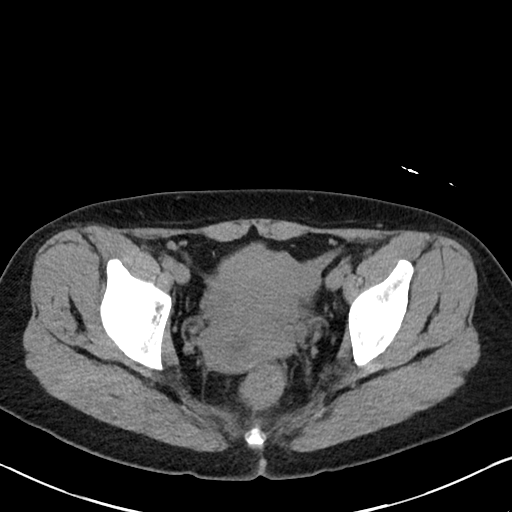
[im 28/96  soft-tissue]
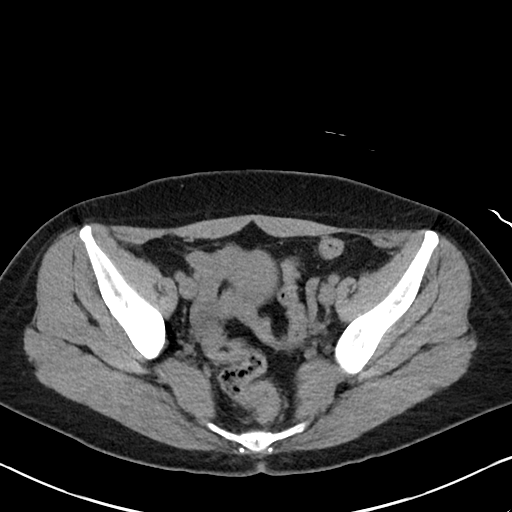
[im 37/96  soft-tissue]
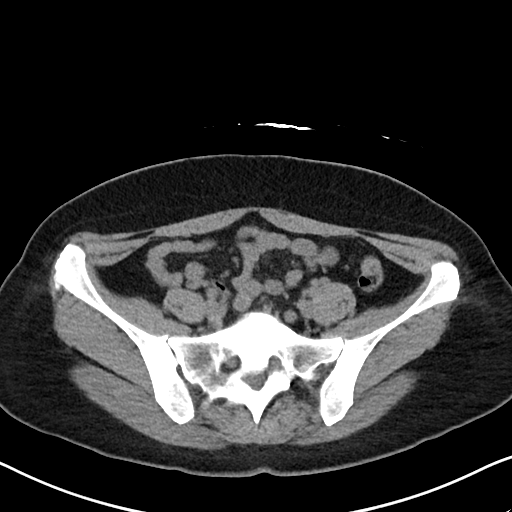
[im 46/96  soft-tissue]
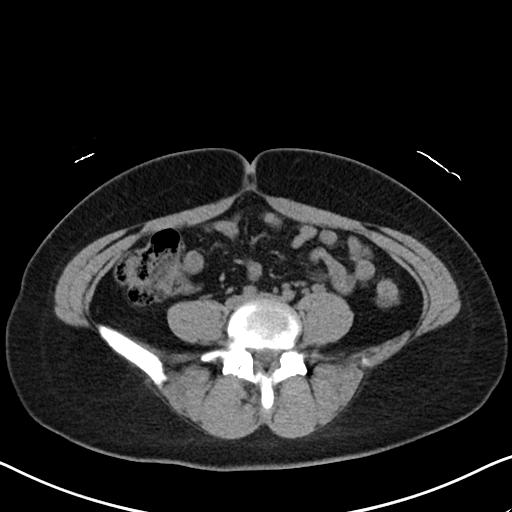
[im 50/96  soft-tissue]
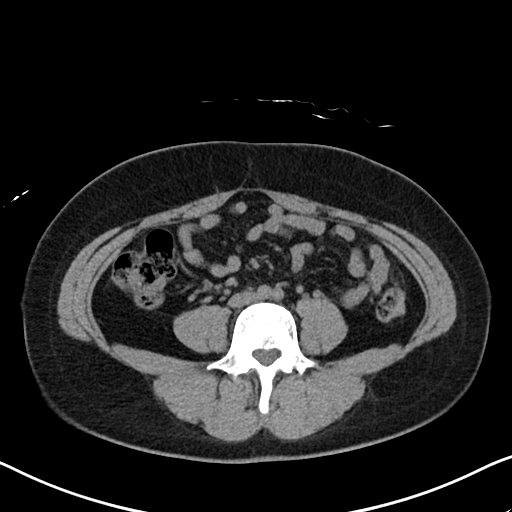
[im 59/96  soft-tissue]
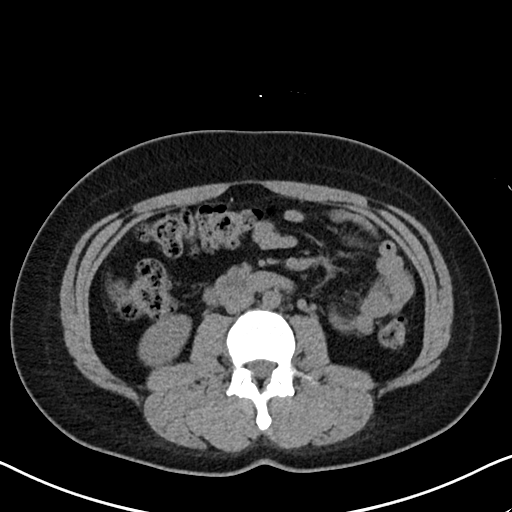
[im 68/96  soft-tissue]
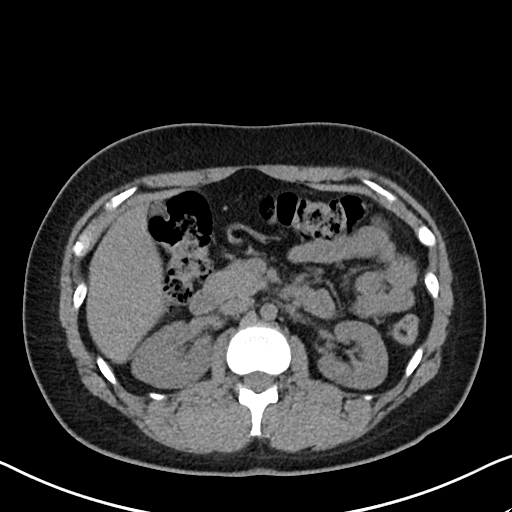
[im 68/96  bone]
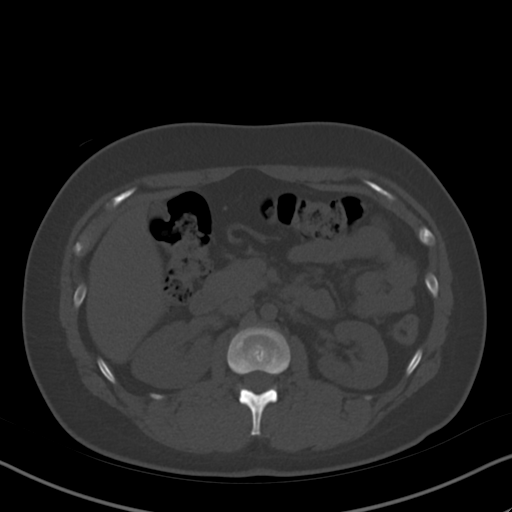
[im 73/96  soft-tissue]
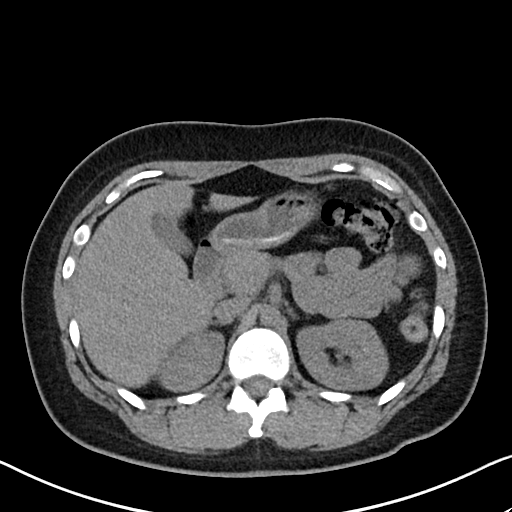
[im 82/96  soft-tissue]
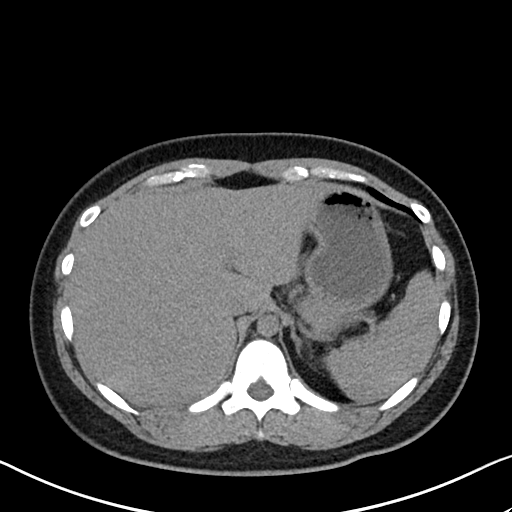
[im 91/96  soft-tissue]
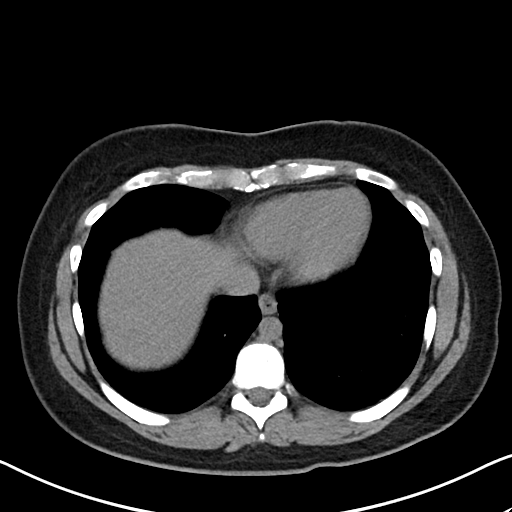

[Series 7: cor · coronal · 0.64mm/px · 3 of 76 slices shown]
[im 26/76  soft-tissue]
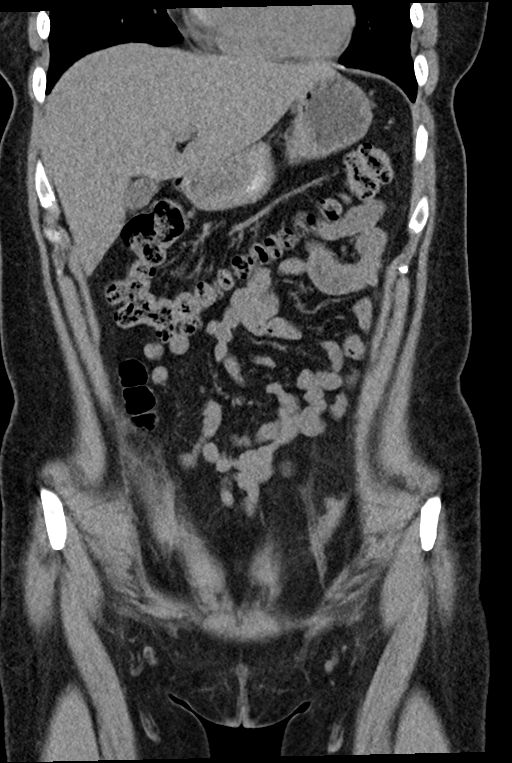
[im 34/76  soft-tissue]
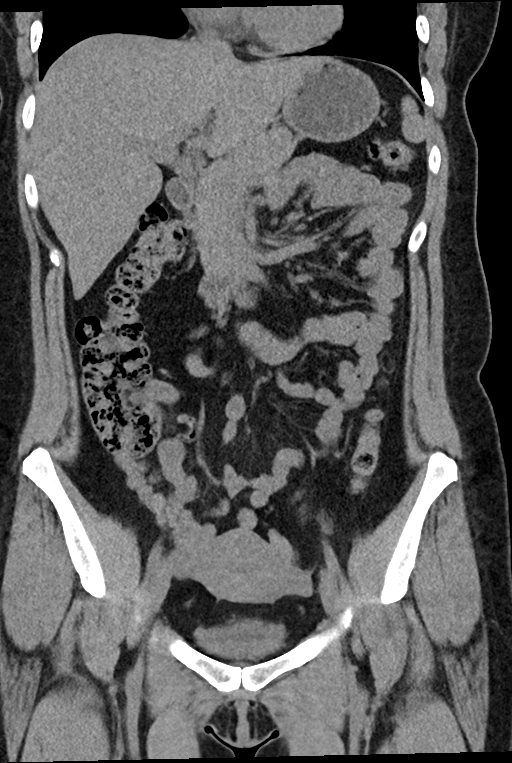
[im 42/76  soft-tissue]
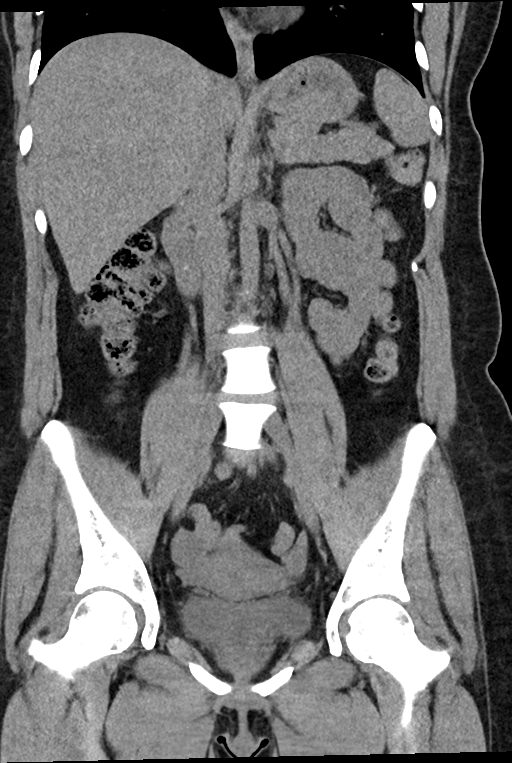

[15 of 46 positions shown; findings below may reference images not displayed]

FINDINGS: Lower chest: Negative.

Hepatobiliary: Negative noncontrast liver and gallbladder.

Pancreas: Negative.

Spleen: Negative.

Adrenals/Urinary Tract: Normal adrenal glands.

No nephrolithiasis. No pararenal inflammation. Negative noncontrast
left kidney and course of the left ureter. Incidental left
hemipelvis phleboliths. Noncontrast right kidney and ureter also
within normal limits. No evidence of distal hydroureter.
Decompressed bladder, although does appear indistinct with evidence
of perivesical mild perivesical inflammatory stranding (such as
coronal image 33).

Stomach/Bowel: Decompressed and negative distal large bowel. Small
coarsely calcified postinflammatory mesenteric lymph node or less
likely diverticula at the junction of the descending and sigmoid
colon on series 3, image 57. No large bowel inflammation, normal
appendix on series 3, image 54. Average volume of retained stool.
Terminal ileum is decompressed and negative. No dilated small bowel.
Evidence of a small gas containing duodenal diverticulum on series
3, image 34. Otherwise negative stomach and duodenum. No active
inflammation. No free air or free fluid.

Vascular/Lymphatic: Normal caliber abdominal aorta. No
atherosclerosis or lymphadenopathy identified.

Reproductive: Negative noncontrast appearance.

Other: No pelvic free fluid.

Musculoskeletal: Negative.
IMPRESSION: 1. Contracted bladder with evidence of perivesical inflammatory
stranding compatible with acute UTI and/or Cystitis.
2. Pelvic phleboliths.  No urinary calculus or obstructive uropathy.
3. Normal appendix. Incidental small duodenum diverticulum
suspected.

## 2023-01-05 ENCOUNTER — Ambulatory Visit: Payer: BC Managed Care – PPO | Admitting: Internal Medicine

## 2023-01-05 ENCOUNTER — Encounter: Payer: Self-pay | Admitting: Internal Medicine

## 2023-01-05 VITALS — BP 112/60 | HR 73 | Ht 64.0 in | Wt 152.6 lb

## 2023-01-05 DIAGNOSIS — M542 Cervicalgia: Secondary | ICD-10-CM | POA: Diagnosis not present

## 2023-01-05 DIAGNOSIS — E039 Hypothyroidism, unspecified: Secondary | ICD-10-CM

## 2023-01-05 LAB — TSH: TSH: 0.8 u[IU]/mL (ref 0.35–5.50)

## 2023-01-05 LAB — T4, FREE: Free T4: 1.42 ng/dL (ref 0.60–1.60)

## 2023-01-05 MED ORDER — LEVOTHYROXINE SODIUM 100 MCG PO TABS: 100.0000 ug | ORAL_TABLET | Freq: Every day | ORAL | 3 refills | Status: AC

## 2023-01-05 NOTE — Progress Notes (Signed)
Patient ID: Laura Baxter, female   DOB: [birth year], 32 y.o.   MRN: [id number]  HPI  Laura Baxter is a 32 y.o.-year-old female, returning for follow-up for hypothyroidism.  She previously saw Dr. Everardo All, but last visit with me 1 year ago.  Interim history: She added [place] - ADHD med - added 3 mo ago - at night.  She has no complaints today other than occasional pressure in her neck, mostly in the morning.  Reviewed history: Pt. has been dx with hyperthyroidism in 2015.  She cycled twice between hyperand hypothyroidism but currently spontaneously hypothyroid >> on Levothyroxine.  Pt is on levothyroxine 100 mcg daily, taken: - in am - fasting - at least 30 min from b'fast - no calcium - no iron (was on this in the past) - + multivitamins along with it  >> moved at night - no PPIs - not on Biotin - off Probiotic  I reviewed pt's thyroid tests: Lab Results  Component Value Date   TSH 1.31 02/10/2022   TSH 0.78 04/03/2021   TSH 0.51 05/02/2020   TSH 1.21 01/16/2020   TSH 0.36 10/21/2019   TSH 0.618 05/13/2019   TSH 0.34 (L) 02/04/2019   TSH 0.85 10/15/2018   TSH 1.06 08/02/2018   TSH 0.31 (L) 04/09/2018   FREET4 0.92 02/10/2022   FREET4 1.08 04/03/2021   FREET4 1.21 01/16/2020   FREET4 1.25 10/21/2019   FREET4 1.20 05/13/2019   FREET4 1.25 02/04/2019   FREET4 1.34 10/15/2018   FREET4 1.05 08/02/2018   FREET4 1.52 04/09/2018   FREET4 1.06 01/11/2018   Antithyroid antibodies: No results found for: "THGAB" No components found for: "TPOAB"  Thyroid U/S (04/12/2021): Mildly heterogeneous thyroid gland without nodules, smaller than before  Patient describes occasional pressure in her neck - first thing in am, but no hoarseness, dysphagia/odynophagia.  She has + FH of thyroid disorders in: Father. No FH of thyroid cancer.  No h/o radiation tx to head or neck. No recent use of iodine supplements.  Pt. also has a history of iron deficiency anemia, hypotension (normal  ACTH stimulation test in 2019). She has 2 children.  No further pregnancy plans.  Husband had a vasectomy.  ROS: + see HPI  Past Medical History:  Diagnosis Date   History of febrile seizure    INFANT   History of hyperthyroidism    DX 2015--  TSH LEVELS NORMALIZED WITHOUT ANY TREATMENT   History of hypotension    History of ovarian cyst    Hypothyroidism, secondary ENDOCRINOLOGIST-  DR Everardo All   SECONDARY TO LYMPHOCYTIC THYROIDITIS   Iron deficiency anemia    Pilonidal cyst with abscess    Wears glasses    Past Surgical History:  Procedure Laterality Date   PILONIDAL CYST EXCISION N/A 06/17/2017   Procedure: CYST EXCISION PILONIDAL;  Surgeon: Kinsinger, De Blanch, MD;  Location: [facility];  Service: General;  Laterality: N/A;   SHOULDER SURGERY Left 2012   capsular repair   Social History   Socioeconomic History   Marital status: Married    Spouse name: Not on file   Number of children: Not on file   Years of education: Not on file   Highest education level: Not on file  Occupational History   Not on file  Tobacco Use   Smoking status: Never   Smokeless tobacco: Never  Vaping Use   Vaping status: Never Used  Substance and Sexual Activity   Alcohol use: No  Drug use: No   Sexual activity: Yes    Comment: IUD placed 02/ 2019 previously  Other Topics Concern   Not on file  Social History Narrative   Not on file   Social Determinants of Health   Financial Resource Strain: Not on file  Food Insecurity: No Food Insecurity (01/13/2022)   Received from [facility], [facility]   Hunger Vital Sign    Worried About Running Out of Food in the Last Year: Never true    Ran Out of Food in the Last Year: Never true  Transportation Needs: Not on file  Physical Activity: Not on file  Stress: Not on file  Social Connections: Unknown (01/13/2022)   Received from [facility], [facility]   Social Network    Social Network: Not on file   Intimate Partner Violence: Unknown (01/13/2022)   Received from [facility], [facility]   HITS    Physically Hurt: Not on file    Insult or Talk Down To: Not on file    Threaten Physical Harm: Not on file    Scream or Curse: Not on file   Current Outpatient Medications on File Prior to Visit  Medication Sig Dispense Refill   doxycycline (VIBRAMYCIN) 100 MG capsule Take 1 capsule (100 mg total) by mouth 2 (two) times daily. 14 capsule 0   FLUoxetine (PROZAC) 20 MG capsule Take 1 capsule (20 mg total) by mouth daily. 90 capsule 1   lamoTRIgine (LAMICTAL) 25 MG tablet Take by mouth.     levothyroxine (SYNTHROID) 100 MCG tablet Take 1 tablet (100 mcg total) by mouth daily before breakfast. 90 tablet 1   predniSONE (DELTASONE) 20 MG tablet Take 2 tablets (40 mg total) by mouth daily. 10 tablet 0   No current facility-administered medications on file prior to visit.   Allergies  Allergen Reactions   Iodine Rash   Shellfish Allergy Rash   Latex Itching    Power in the gloves cause redness   Family History  Problem Relation Age of Onset   Depression Mother    Anemia Mother    Heart attack Father    Hypertension Father    Hyperlipidemia Father    Heart disease Father    Depression Father    Glaucoma Father    Graves' disease Father    Thyroid disease Father    Osteoporosis Father    Cancer Maternal Grandmother        pancreatic   Breast cancer Paternal Grandmother    Leukemia Paternal Grandmother    Leukemia Paternal Grandfather    PE: BP 112/60   Pulse 73   Ht 5\' 4"  (1.626 m)   Wt 152 lb 9.6 oz (69.2 kg)   SpO2 99%   BMI 26.19 kg/m  Wt Readings from Last 3 Encounters:  01/05/23 152 lb 9.6 oz (69.2 kg)  12/04/22 150 lb (68 kg)  01/03/22 153 lb 12.8 oz (69.8 kg)   Constitutional: normal weight, in NAD Eyes:  EOMI, no exophthalmos ENT: no neck masses, no cervical lymphadenopathy Cardiovascular: RRR, No MRG Respiratory: CTA B Musculoskeletal: no  deformities Skin:no rashes Neurological: no tremor with outstretched hands  ASSESSMENT: 1.  Acquired hypothyroidism  2.  Neck pressure/discomfort  PLAN:  1. Patient with long-standing hypothyroidism, on levothyroxine therapy.  She initially had alternating hyper and hypothyroidism, consistent with Hashimoto's thyroiditis. - most likely related to thyroiditis, possibly autoimmune - thyroid ultrasound report from 03/2021 showed heterogeneity consistent with thyroiditis - latest thyroid labs  reviewed with pt. >> normal: Lab Results  Component Value Date   TSH 1.31 02/10/2022  - she continues on LT4 100 mcg daily - pt feels good on this dose. - we discussed about taking the thyroid hormone every day, with water, >30 minutes before breakfast, separated by >4 hours from acid reflux medications, calcium, iron, multivitamins. Pt. Is now taking it correctly.  At last visit, she was taking multivitamins along with levothyroxine and I advised her to move the multivitamins at least 4 hours later.   - will check thyroid tests today: TSH and fT4 - If labs are abnormal, she will need to return for repeat TFTs in 1.5 months - I will see her back in 1 year  2.  Neck pressure/discomfort -Most likely related to patient in the setting of thyroiditis -She has occasional pressure in her neck mostly in the morning - Discussed that this could be related to dry mouth-she is already planning to start using a humidifier, as advised by her children's pediatrician -At last visit she was wondering whether she needed a repeat ultrasound.  She was previously getting them every 2 years.  We discussed that in the absence of a nodule with just shrinking thyroid gland, no further ultrasounds are needed -Will continue to monitor her clinically  Needs refills.   Component     Latest Ref Rng 01/05/2023  TSH     0.35 - 5.50 uIU/mL 0.80   T4,Free(Direct)     0.60 - 1.60 ng/dL 6.04   TFTs are normal.  Carlus Pavlov, MD PhD [facility]

## 2023-01-05 NOTE — Patient Instructions (Signed)
Please continue Levothyroxine 100 mcg daily. ° °Take the thyroid hormone every day, with water, at least 30 minutes before breakfast, separated by at least 4 hours from: °- acid reflux medications °- calcium °- iron °- multivitamins ° °Please stop at the lab. ° °Please return in 1 year.  ° °

## 2023-01-06 DIAGNOSIS — F411 Generalized anxiety disorder: Secondary | ICD-10-CM | POA: Diagnosis not present

## 2023-01-13 DIAGNOSIS — Z9189 Other specified personal risk factors, not elsewhere classified: Secondary | ICD-10-CM | POA: Diagnosis not present

## 2023-01-15 DIAGNOSIS — F411 Generalized anxiety disorder: Secondary | ICD-10-CM | POA: Diagnosis not present

## 2023-02-04 DIAGNOSIS — F411 Generalized anxiety disorder: Secondary | ICD-10-CM | POA: Diagnosis not present

## 2023-12-28 ENCOUNTER — Encounter: Payer: Self-pay | Admitting: Radiology

## 2024-01-05 ENCOUNTER — Ambulatory Visit: Payer: BC Managed Care – PPO | Admitting: Internal Medicine

## 2024-01-05 NOTE — Progress Notes (Deleted)
 Patient ID: Laura Baxter, female   DOB: [birth year], 33 y.o.   MRN: [id number]  HPI  Laura Baxter is a 33 y.o.-year-old female, returning for follow-up for hypothyroidism.  She previously saw Dr. Kassie, but last visit with me 1 year ago.  Interim history: She has no complaints today other than occasional pressure in her neck, mostly in the morning.  Reviewed history: Pt. has been dx with hyperthyroidism in 2015.  She cycled twice between hyperand hypothyroidism but currently spontaneously hypothyroid >> on Levothyroxine .  Pt is on levothyroxine  100 mcg daily, taken: - in am - fasting - at least 30 min from b'fast - no calcium  - no iron  (was on this in the past) - + multivitamins along with it  >> moved at night - no PPIs - not on Biotin - off Probiotic  I reviewed pt's thyroid  tests: 08/13/2023: TSH 0.010 Lab Results  Component Value Date   TSH 0.80 01/05/2023   TSH 1.31 02/10/2022   TSH 0.78 04/03/2021   TSH 0.51 05/02/2020   TSH 1.21 01/16/2020   TSH 0.36 10/21/2019   TSH 0.618 05/13/2019   TSH 0.34 (L) 02/04/2019   TSH 0.85 10/15/2018   TSH 1.06 08/02/2018   FREET4 1.42 01/05/2023   FREET4 0.92 02/10/2022   FREET4 1.08 04/03/2021   FREET4 1.21 01/16/2020   FREET4 1.25 10/21/2019   FREET4 1.20 05/13/2019   FREET4 1.25 02/04/2019   FREET4 1.34 10/15/2018   FREET4 1.05 08/02/2018   FREET4 1.52 04/09/2018   Antithyroid antibodies: No results found for: THGAB No components found for: TPOAB  Thyroid  U/S (04/12/2021): Mildly heterogeneous thyroid  gland without nodules, smaller than before  Patient describes occasional pressure in her neck - first thing in am, but no hoarseness, dysphagia/odynophagia.  She has + FH of thyroid  disorders in: Father. No FH of thyroid  cancer.  No h/o radiation tx to head or neck. No recent use of iodine supplements.  Pt. also has a history of iron  deficiency anemia, hypotension (normal ACTH stimulation test in 2019). She has  2 children.  No further pregnancy plans.  Husband had a vasectomy.  ROS: + see HPI  Past Medical History:  Diagnosis Date   History of febrile seizure    INFANT   History of hyperthyroidism    DX 2015--  TSH LEVELS NORMALIZED WITHOUT ANY TREATMENT   History of hypotension    History of ovarian cyst    Hypothyroidism, secondary ENDOCRINOLOGIST-  DR KASSIE   SECONDARY TO LYMPHOCYTIC THYROIDITIS   Iron  deficiency anemia    Pilonidal cyst with abscess    Wears glasses    Past Surgical History:  Procedure Laterality Date   PILONIDAL CYST EXCISION N/A 06/17/2017   Procedure: CYST EXCISION PILONIDAL;  Surgeon: Kinsinger, Herlene Righter, MD;  Location: [facility];  Service: General;  Laterality: N/A;   SHOULDER SURGERY Left 2012   capsular repair   Social History   Socioeconomic History   Marital status: Married    Spouse name: Not on file   Number of children: Not on file   Years of education: Not on file   Highest education level: Not on file  Occupational History   Not on file  Tobacco Use   Smoking status: Never   Smokeless tobacco: Never  Vaping Use   Vaping status: Never Used  Substance and Sexual Activity   Alcohol use: No   Drug use: No   Sexual activity: Yes    Comment: IUD  placed 02/ 2019 previously  Other Topics Concern   Not on file  Social History Narrative   Not on file   Social Drivers of Health   Financial Resource Strain: Not on file  Food Insecurity: No Food Insecurity (01/13/2022)   Received [facility]   Hunger Vital Sign    Within the past 12 months, you worried that your food would run out before you got the money to buy more.: Never true    Within the past 12 months, the food you bought just didn't last and you didn't have money to get more.: Never true  Transportation Needs: Not on file  Physical Activity: Not on file  Stress: Not on file  Social Connections: Unknown (01/13/2022)   Received from [facility]   Social  Network    Social Network: Not on file  Intimate Partner Violence: Unknown (01/13/2022)   Received from [facility]   HITS    Physically Hurt: Not on file    Insult or Talk Down To: Not on file    Threaten Physical Harm: Not on file    Scream or Curse: Not on file   Current Outpatient Medications on File Prior to Visit  Medication Sig Dispense Refill   doxycycline  (VIBRAMYCIN ) 100 MG capsule Take 1 capsule (100 mg total) by mouth 2 (two) times daily. (Patient not taking: Reported on 01/05/2023) 14 capsule 0   FLUoxetine  (PROZAC ) 20 MG capsule Take 1 capsule (20 mg total) by mouth daily. 90 capsule 1   JORNAY PM 40 MG CP24 Take 1 capsule by mouth at bedtime.     lamoTRIgine (LAMICTAL) 25 MG tablet Take by mouth.     levothyroxine  (SYNTHROID ) 100 MCG tablet Take 1 tablet (100 mcg total) by mouth daily before breakfast. 90 tablet 3   predniSONE  (DELTASONE ) 20 MG tablet Take 2 tablets (40 mg total) by mouth daily. (Patient not taking: Reported on 01/05/2023) 10 tablet 0   No current facility-administered medications on file prior to visit.   Allergies  Allergen Reactions   Iodine Rash   Shellfish Allergy Rash   Latex Itching    Power in the gloves cause redness   Family History  Problem Relation Age of Onset   Depression Mother    Anemia Mother    Heart attack Father    Hypertension Father    Hyperlipidemia Father    Heart disease Father    Depression Father    Glaucoma Father    Graves' disease Father    Thyroid  disease Father    Osteoporosis Father    Cancer Maternal Grandmother        pancreatic   Breast cancer Paternal Grandmother    Leukemia Paternal Grandmother    Leukemia Paternal Grandfather    PE: There were no vitals taken for this visit. Wt Readings from Last 3 Encounters:  01/05/23 152 lb 9.6 oz (69.2 kg)  12/04/22 150 lb (68 kg)  01/03/22 153 lb 12.8 oz (69.8 kg)   Constitutional: normal weight, in NAD Eyes:  EOMI, no exophthalmos ENT: no neck  masses, no cervical lymphadenopathy Cardiovascular: RRR, No MRG Respiratory: CTA B Musculoskeletal: no deformities Skin:no rashes Neurological: no tremor with outstretched hands  ASSESSMENT: 1.  Hashimoto's hypothyroidism  2.  Neck pressure/discomfort  PLAN:  1. Patient with longstanding hypothyroidism, on levothyroxine  therapy.  She had initially alternating hyper and hypothyroidism.  Thyroid  ultrasound report from 03/2021 showed heterogeneity, consistent with thyroiditis, possibly autoimmune. - thyroid  labs from last visit  reviewed with pt. she had a normal TSH at that time: Lab Results  Component Value Date   TSH 0.80 01/05/2023  - However, reviewing her chart, she had a low TSH of 0.010 on 08/13/2023 at her visit with PCP.  I was not aware of this value... - she continues on LT4 100 mcg daily - pt feels good on this dose. - we discussed about taking the thyroid  hormone every day, with water, >30 minutes before breakfast, separated by >4 hours from acid reflux medications, calcium , iron , multivitamins. Pt. is taking it correctly.  She was previously taking multivitamins along with levothyroxine  but we moved the multivitamins at least 4 hours later. - will check thyroid  tests today: TSH and fT4 - If labs are abnormal, she will need to return for repeat TFTs in 1.5 months - OTW, I will see her back in a year  2.  Neck pressure/discomfort -Most likely related to thyroid  inflammation in the setting of thyroiditis -She has occasional pressure in her neck mostly in the morning - Discussed that this could be related to dry mouth-at last visit, I advised her to use a humidifier. -At last visit, upon her questioning, we discussed that she does not need further imaging especially in the setting of a shrinking thyroid  gland, without nodules.  She was previously getting ultrasounds every 2 years. - We will continue to monitor her clinically  Needs refills.  Lela Fendt, MD PhD [place]  Endocrinology
